# Patient Record
Sex: Female | Born: 1980 | Race: Black or African American | Hispanic: No | Marital: Married | Smoking: Never smoker
Health system: Southern US, Community
[De-identification: ages and names within clinical notes are randomized; demographics above are authoritative.]

## PROBLEM LIST (undated history)

## (undated) ENCOUNTER — Inpatient Hospital Stay (HOSPITAL_COMMUNITY): Payer: Self-pay

## (undated) DIAGNOSIS — R519 Headache, unspecified: Secondary | ICD-10-CM

## (undated) DIAGNOSIS — Z87440 Personal history of urinary (tract) infections: Secondary | ICD-10-CM

## (undated) DIAGNOSIS — D649 Anemia, unspecified: Secondary | ICD-10-CM

## (undated) DIAGNOSIS — M5386 Other specified dorsopathies, lumbar region: Secondary | ICD-10-CM

## (undated) DIAGNOSIS — H1850 Unspecified hereditary corneal dystrophies: Secondary | ICD-10-CM

## (undated) DIAGNOSIS — H18509 Unspecified hereditary corneal dystrophies, unspecified eye: Secondary | ICD-10-CM

## (undated) DIAGNOSIS — I10 Essential (primary) hypertension: Secondary | ICD-10-CM

## (undated) DIAGNOSIS — R51 Headache: Secondary | ICD-10-CM

## (undated) DIAGNOSIS — B54 Unspecified malaria: Secondary | ICD-10-CM

## (undated) HISTORY — DX: Essential (primary) hypertension: I10

## (undated) HISTORY — PX: OTHER SURGICAL HISTORY: SHX169

---

## 2015-12-07 ENCOUNTER — Inpatient Hospital Stay (HOSPITAL_COMMUNITY)
Admission: AD | Admit: 2015-12-07 | Discharge: 2015-12-07 | Disposition: A | Discharge status: Home / Self Care | Payer: Medicaid Other | Source: Ambulatory Visit | Attending: Obstetrics & Gynecology | Admitting: Obstetrics & Gynecology

## 2015-12-07 ENCOUNTER — Encounter (HOSPITAL_COMMUNITY): Payer: Self-pay | Admitting: *Deleted

## 2015-12-07 DIAGNOSIS — N390 Urinary tract infection, site not specified: Secondary | ICD-10-CM

## 2015-12-07 DIAGNOSIS — R300 Dysuria: Secondary | ICD-10-CM | POA: Diagnosis present

## 2015-12-07 DIAGNOSIS — R319 Hematuria, unspecified: Secondary | ICD-10-CM

## 2015-12-07 DIAGNOSIS — R350 Frequency of micturition: Secondary | ICD-10-CM | POA: Insufficient documentation

## 2015-12-07 DIAGNOSIS — Z79899 Other long term (current) drug therapy: Secondary | ICD-10-CM | POA: Insufficient documentation

## 2015-12-07 LAB — CBC WITH DIFFERENTIAL/PLATELET
BASOS ABS: 0 10*3/uL (ref 0.0–0.1)
BASOS PCT: 0 %
EOS ABS: 0.1 10*3/uL (ref 0.0–0.7)
EOS PCT: 1 %
HCT: 36.6 % (ref 36.0–46.0)
HEMOGLOBIN: 12.9 g/dL (ref 12.0–15.0)
LYMPHS ABS: 1.6 10*3/uL (ref 0.7–4.0)
Lymphocytes Relative: 21 %
MCH: 31.2 pg (ref 26.0–34.0)
MCHC: 35.2 g/dL (ref 30.0–36.0)
MCV: 88.4 fL (ref 78.0–100.0)
Monocytes Absolute: 0.3 10*3/uL (ref 0.1–1.0)
Monocytes Relative: 4 %
NEUTROS PCT: 74 %
Neutro Abs: 5.4 10*3/uL (ref 1.7–7.7)
PLATELETS: 280 10*3/uL (ref 150–400)
RBC: 4.14 MIL/uL (ref 3.87–5.11)
RDW: 13.4 % (ref 11.5–15.5)
WBC: 7.4 10*3/uL (ref 4.0–10.5)

## 2015-12-07 LAB — URINALYSIS, ROUTINE W REFLEX MICROSCOPIC
Bilirubin Urine: NEGATIVE
GLUCOSE, UA: NEGATIVE mg/dL
Ketones, ur: NEGATIVE mg/dL
NITRITE: NEGATIVE
PH: 6 (ref 5.0–8.0)
Protein, ur: 100 mg/dL — AB
SPECIFIC GRAVITY, URINE: 1.025 (ref 1.005–1.030)

## 2015-12-07 LAB — URINE MICROSCOPIC-ADD ON

## 2015-12-07 LAB — POCT PREGNANCY, URINE: Preg Test, Ur: NEGATIVE

## 2015-12-07 MED ORDER — SULFAMETHOXAZOLE-TRIMETHOPRIM 800-160 MG PO TABS: 1.0000 | ORAL_TABLET | Freq: Two times a day (BID) | ORAL | 1 refills | Status: DC

## 2015-12-07 MED ORDER — PHENAZOPYRIDINE HCL 100 MG PO TABS: 100.0000 mg | ORAL_TABLET | Freq: Three times a day (TID) | ORAL | 0 refills | Status: DC | PRN

## 2015-12-07 NOTE — MAU Provider Note (Signed)
Chief Complaint:  Dysuria   First Provider Initiated Contact with Patient 12/07/15 0818     HPI: Amber Haas is a 35 y.o. G5P5 who presents to maternity admissions reporting urinary frequency and burning since last night.  Also has nausea and body aches.  No vomiting or constipation.  Had a loose stool this morning.  No bleeding or frank hematuria.     She reports no vaginal bleeding, vaginal itching/burning, h/a, dizziness, or fever/chills.    Dysuria   This is a new problem. The current episode started yesterday. The problem occurs every urination. The problem has been unchanged. The quality of the pain is described as burning. The pain is moderate. There has been no fever. She is sexually active. There is no history of pyelonephritis. Associated symptoms include frequency, nausea and urgency. Pertinent negatives include no chills, discharge, flank pain, hematuria, sweats or vomiting. She has tried nothing for the symptoms.   RN Note: Pt reports dysuria and urinary frequency since 12mn, nausea and body aches  Past Medical History: History reviewed. No pertinent past medical history.  Past obstetric history: OB History  Gravida Para Term Preterm AB Living  5 5       5   SAB TAB Ectopic Multiple Live Births               # Outcome Date GA Lbr Len/2nd Weight Sex Delivery Anes PTL Lv  5 Para           4 Para           3 Para           2 Para           1 Para               Past Surgical History: History reviewed. No pertinent surgical history.  Family History: History reviewed. No pertinent family history.  Social History: Social History  Substance Use Topics  . Smoking status: Never Smoker  . Smokeless tobacco: Never Used  . Alcohol use No    Allergies: No Known Allergies  Meds:  Prescriptions Prior to Admission  Medication Sig Dispense Refill Last Dose  . SPIRULINA PO Take 2 tablets by mouth 3 (three) times daily.   12/06/2015 at Unknown time    I have  reviewed patient's Past Medical Hx, Surgical Hx, Family Hx, Social Hx, medications and allergies.  ROS:  Review of Systems  Constitutional: Negative for chills and fever.  Gastrointestinal: Positive for diarrhea (one loose stool) and nausea. Negative for abdominal pain, constipation and vomiting.  Genitourinary: Positive for dysuria, frequency and urgency. Negative for flank pain, hematuria, vaginal bleeding and vaginal discharge.  Musculoskeletal: Positive for back pain (low back soreness).   Other systems negative     Physical Exam  Patient Vitals for the past 24 hrs:  BP Temp Temp src Pulse Resp SpO2 Height Weight  12/07/15 0655 128/91 98.4 F (36.9 C) Oral 79 17 100 % 5\' 1"  (1.549 m) 148 lb (67.1 kg)   Constitutional: Well-developed, well-nourished female in no acute distress.  Cardiovascular: slightly tachycardic, 90-100, normal rhythm, no ectopy audible, S1 & S2 heard, no murmur Respiratory: normal effort, no distress. Lungs CTAB with no wheezes or crackles GI: Abd soft, non-tender.  Nondistended.  No rebound, No guarding.  Bowel Sounds audible  MS: Extremities nontender, no edema, normal ROM Neurologic: Alert and oriented x 4.   Grossly nonfocal. GU: Neg CVAT bilaterally Skin:  Warm and Dry Psych:  Affect appropriate.    Labs: Results for orders placed or performed during the hospital encounter of 12/07/15 (from the past 72 hour(s))  Urinalysis, Routine w reflex microscopic (not [facility])     Status: Abnormal   Collection Time: 12/07/15  6:40 AM  Result Value Ref Range   Color, Urine YELLOW YELLOW   APPearance CLOUDY (A) CLEAR   Specific Gravity, Urine 1.025 1.005 - 1.030   pH 6.0 5.0 - 8.0   Glucose, UA NEGATIVE NEGATIVE mg/dL   Hgb urine dipstick LARGE (A) NEGATIVE   Bilirubin Urine NEGATIVE NEGATIVE   Ketones, ur NEGATIVE NEGATIVE mg/dL   Protein, ur 161100 (A) NEGATIVE mg/dL   Nitrite NEGATIVE NEGATIVE   Leukocytes, UA MODERATE (A) NEGATIVE  Urine microscopic-add  on     Status: Abnormal   Collection Time: 12/07/15  6:40 AM  Result Value Ref Range   Squamous Epithelial / LPF 0-5 (A) NONE SEEN   WBC, UA 6-30 0 - 5 WBC/hpf   RBC / HPF TOO NUMEROUS TO COUNT 0 - 5 RBC/hpf   Bacteria, UA FEW (A) NONE SEEN  Culture, Urine     Status: None (Preliminary result)   Collection Time: 12/07/15  6:40 AM  Result Value Ref Range   Specimen Description URINE, CLEAN CATCH    Special Requests NONE    Culture      CULTURE REINCUBATED FOR BETTER GROWTH Performed at [facility]    Report Status PENDING   Pregnancy, urine POC     Status: None   Collection Time: 12/07/15  7:07 AM  Result Value Ref Range   Preg Test, Ur NEGATIVE NEGATIVE    Comment:        THE SENSITIVITY OF THIS METHODOLOGY IS >24 mIU/mL   CBC with Differential/Platelet     Status: None   Collection Time: 12/07/15  8:40 AM  Result Value Ref Range   WBC 7.4 4.0 - 10.5 K/uL   RBC 4.14 3.87 - 5.11 MIL/uL   Hemoglobin 12.9 12.0 - 15.0 g/dL   HCT 09.636.6 04.536.0 - 40.946.0 %   MCV 88.4 78.0 - 100.0 fL   MCH 31.2 26.0 - 34.0 pg   MCHC 35.2 30.0 - 36.0 g/dL   RDW 81.113.4 91.411.5 - 78.215.5 %   Platelets 280 150 - 400 K/uL   Neutrophils Relative % 74 %   Neutro Abs 5.4 1.7 - 7.7 K/uL   Lymphocytes Relative 21 %   Lymphs Abs 1.6 0.7 - 4.0 K/uL   Monocytes Relative 4 %   Monocytes Absolute 0.3 0.1 - 1.0 K/uL   Eosinophils Relative 1 %   Eosinophils Absolute 0.1 0.0 - 0.7 K/uL   Basophils Relative 0 %   Basophils Absolute 0.0 0.0 - 0.1 K/uL    Imaging:  No results found.  MAU Course/MDM: I have ordered labs as follows:  UA, urine culture and CBC added due to slight tachycardia Imaging ordered: none Results reviewed.   No evidence of sepsis.  Probable UTI. WIll treat and culture urine  Pt stable at time of discharge.  Assessment: Probable urinary tract infection  Plan: Discharge home Recommend push PO fluids Rx sent for Bactrim DS for presumed UTI Rx sent for pyridium for  dysuria  Encouraged to return here or to other Urgent Care/ED if she develops worsening of symptoms, increase in pain, fever, or other concerning symptoms.   Wynelle BourgeoisMarie Chauncey Sciulli CNM, MSN Certified Nurse-Midwife 12/07/2015 8:18 AM

## 2015-12-07 NOTE — Discharge Instructions (Signed)

## 2015-12-07 NOTE — MAU Note (Signed)
Pt reports dysuria and urinary frequency since 12mn, nausea and body aches.

## 2015-12-10 LAB — URINE CULTURE: Culture: >=100000 — AB

## 2015-12-11 ENCOUNTER — Other Ambulatory Visit (HOSPITAL_COMMUNITY): Payer: Self-pay | Admitting: Obstetrics and Gynecology

## 2016-01-04 ENCOUNTER — Ambulatory Visit (HOSPITAL_COMMUNITY)
Admission: EM | Admit: 2016-01-04 | Discharge: 2016-01-04 | Disposition: A | Discharge status: Home / Self Care | Payer: Medicaid Other | Attending: Family Medicine | Admitting: Family Medicine

## 2016-01-04 ENCOUNTER — Encounter (HOSPITAL_COMMUNITY): Payer: Self-pay | Admitting: Emergency Medicine

## 2016-01-04 DIAGNOSIS — B9689 Other specified bacterial agents as the cause of diseases classified elsewhere: Secondary | ICD-10-CM

## 2016-01-04 DIAGNOSIS — B373 Candidiasis of vulva and vagina: Secondary | ICD-10-CM | POA: Insufficient documentation

## 2016-01-04 DIAGNOSIS — N898 Other specified noninflammatory disorders of vagina: Secondary | ICD-10-CM

## 2016-01-04 DIAGNOSIS — Z79899 Other long term (current) drug therapy: Secondary | ICD-10-CM | POA: Diagnosis not present

## 2016-01-04 DIAGNOSIS — B3731 Acute candidiasis of vulva and vagina: Secondary | ICD-10-CM

## 2016-01-04 DIAGNOSIS — N760 Acute vaginitis: Secondary | ICD-10-CM | POA: Insufficient documentation

## 2016-01-04 LAB — POCT PREGNANCY, URINE: Preg Test, Ur: NEGATIVE

## 2016-01-04 MED ORDER — FLUCONAZOLE 150 MG PO TABS: ORAL_TABLET | ORAL | 0 refills | Status: DC

## 2016-01-04 MED ORDER — METRONIDAZOLE 500 MG PO TABS: 500.0000 mg | ORAL_TABLET | Freq: Two times a day (BID) | ORAL | 0 refills | Status: DC

## 2016-01-04 NOTE — ED Provider Notes (Signed)
CSN: [id number]     Arrival date & time 01/04/16  1051 History   First MD Initiated Contact with Patient 01/04/16 1241     Chief Complaint  Patient presents with  . Vaginal Discharge   (Consider location/radiation/quality/duration/timing/severity/associated sxs/prior Treatment) 35 year old female with a two-week history of vaginal discharge described as thick white malodorous associated with local vulvovaginal irritation and burning. She states this started several days after a course of Septra for UTI seen in emergency department. She states her urinary symptoms have all abated.      History reviewed. No pertinent past medical history. History reviewed. No pertinent surgical history. No family history on file. Social History  Substance Use Topics  . Smoking status: Never Smoker  . Smokeless tobacco: Never Used  . Alcohol use No   OB History    Gravida Para Term Preterm AB Living   5 5       5    SAB TAB Ectopic Multiple Live Births                 Review of Systems  Constitutional: Negative.   Genitourinary: Positive for vaginal discharge and vaginal pain. Negative for dysuria, flank pain, frequency, genital sores, hematuria, menstrual problem, pelvic pain and vaginal bleeding.  Musculoskeletal: Negative.   Neurological: Negative.   All other systems reviewed and are negative.   Allergies  Review of patient's allergies indicates no known allergies.  Home Medications   Prior to Admission medications   Medication Sig Start Date End Date Taking? Authorizing Provider  CYCLOBENZAPRINE HCL PO Take by mouth.   Yes Historical Provider, MD  ibuprofen (ADVIL,MOTRIN) 200 MG tablet Take 200 mg by mouth every 6 (six) hours as needed.   Yes Historical Provider, MD  fluconazole (DIFLUCAN) 150 MG tablet 1 tab po x 1. May repeat in 72 hours if no improvement 01/04/16   Hayden Rasmussen, NP  metroNIDAZOLE (FLAGYL) 500 MG tablet Take 1 tablet (500 mg total) by mouth 2 (two) times daily. X 7  days 01/04/16   Hayden Rasmussen, NP  phenazopyridine (PYRIDIUM) 100 MG tablet Take 1 tablet (100 mg total) by mouth 3 (three) times daily as needed for pain. Patient not taking: Reported on 01/04/2016 12/07/15   Aviva Signs, CNM   Meds Ordered and Administered this Visit  Medications - No data to display  BP (!) 142/105 (BP Location: Right Arm)   Pulse 83   Temp 97.9 F (36.6 C) (Oral)   Resp 18   LMP 12/21/2015   SpO2 100%  No data found.   Physical Exam  Constitutional: She is oriented to person, place, and time. She appears well-developed and well-nourished. No distress.  Neck: Neck supple.  Cardiovascular: Normal rate.   Pulmonary/Chest: Effort normal. No respiratory distress.  Abdominal: Soft. She exhibits no distension. There is no tenderness.  Genitourinary:  Genitourinary Comments: Normal external female genitalia. There is a scant amount of tan white discharge at the introitus. The vaginal walls are erythematous and coated with a cottage cheese textured discharge. The cervix is just left of the midline and coated with same material as well as a creamy thin vanilla colored discharge. Ectocervix with minor erythema. No CMT. No right adnexal tenderness. Minor left adnexal tenderness. No palpable masses.  Musculoskeletal: Normal range of motion. She exhibits no edema.  Neurological: She is alert and oriented to person, place, and time. No cranial nerve deficit. She exhibits normal muscle tone.  Skin: Skin is warm and dry.  Psychiatric:  She has a normal mood and affect.  Nursing note and vitals reviewed.   Urgent Care Course   Clinical Course    Procedures (including critical care time)  Labs Review Labs Reviewed  URINE CULTURE  POCT PREGNANCY, URINE  CERVICOVAGINAL ANCILLARY ONLY   Results for orders placed or performed during the hospital encounter of 01/04/16  Pregnancy, urine POC  Result Value Ref Range   Preg Test, Ur NEGATIVE NEGATIVE     Imaging  Review No results found.   Visual Acuity Review  Right Eye Distance:   Left Eye Distance:   Bilateral Distance:    Right Eye Near:   Left Eye Near:    Bilateral Near:         MDM   1. Bacterial vaginosis   2. Yeast vaginitis   3. Vaginal discharge    Take the medications as directed. A culture of your urine has been obtained. Samples for other infections were obtained during the pelvic exam. If any of these tests come back positive for organisms that you were not treated for here we can call you on the phone for treatment/prescriptions. For worsening, new symptoms problems may return. Meds ordered this encounter  Medications  . ibuprofen (ADVIL,MOTRIN) 200 MG tablet    Sig: Take 200 mg by mouth every 6 (six) hours as needed.  . CYCLOBENZAPRINE HCL PO    Sig: Take by mouth.  . fluconazole (DIFLUCAN) 150 MG tablet    Sig: 1 tab po x 1. May repeat in 72 hours if no improvement    Dispense:  2 tablet    Refill:  0    Order Specific Question:   Supervising Provider    Answer:   Linna HoffKINDL, JAMES D [phone][[id number]]  . metroNIDAZOLE (FLAGYL) 500 MG tablet    Sig: Take 1 tablet (500 mg total) by mouth 2 (two) times daily. X 7 days    Dispense:  14 tablet    Refill:  0    Order Specific Question:   Supervising Provider    Answer:   Linna HoffKINDL, JAMES D [[id number]]       Hayden Rasmussenavid Torianne Laflam, NP 01/04/16 1320

## 2016-01-04 NOTE — ED Triage Notes (Signed)
Onset 12 days ago of vaginal discharge.  Used 7 day cream, then had menstrual cycle and now symptoms continued.  Burning and irritation and odor to discharge that is different from usual.  No abdominal pain or back pain

## 2016-01-04 NOTE — Discharge Instructions (Signed)
Take the medications as directed. A culture of your urine has been obtained. Samples for other infections were obtained during the pelvic exam. If any of these tests come back positive for organisms that you were not treated for here we can call you on the phone for treatment/prescriptions. For worsening, new symptoms problems may return.

## 2016-01-05 LAB — CERVICOVAGINAL ANCILLARY ONLY
Chlamydia: NEGATIVE
NEISSERIA GONORRHEA: NEGATIVE
Wet Prep (BD Affirm): POSITIVE — AB

## 2016-01-05 LAB — URINE CULTURE

## 2016-01-07 ENCOUNTER — Emergency Department (HOSPITAL_COMMUNITY)
Admission: EM | Admit: 2016-01-07 | Discharge: 2016-01-08 | Disposition: A | Discharge status: Home / Self Care | Payer: Medicaid Other | Attending: Emergency Medicine | Admitting: Emergency Medicine

## 2016-01-07 ENCOUNTER — Emergency Department (HOSPITAL_COMMUNITY): Payer: Medicaid Other

## 2016-01-07 ENCOUNTER — Other Ambulatory Visit: Payer: Self-pay

## 2016-01-07 ENCOUNTER — Telehealth (HOSPITAL_COMMUNITY): Payer: Self-pay | Admitting: Internal Medicine

## 2016-01-07 ENCOUNTER — Encounter (HOSPITAL_COMMUNITY): Payer: Self-pay

## 2016-01-07 DIAGNOSIS — M79602 Pain in left arm: Secondary | ICD-10-CM

## 2016-01-07 DIAGNOSIS — R200 Anesthesia of skin: Secondary | ICD-10-CM | POA: Diagnosis not present

## 2016-01-07 DIAGNOSIS — M79605 Pain in left leg: Secondary | ICD-10-CM

## 2016-01-07 LAB — BASIC METABOLIC PANEL
Anion gap: 8 (ref 5–15)
BUN: 11 mg/dL (ref 6–20)
CALCIUM: 9.5 mg/dL (ref 8.9–10.3)
CO2: 25 mmol/L (ref 22–32)
CREATININE: 1.02 mg/dL — AB (ref 0.44–1.00)
Chloride: 107 mmol/L (ref 101–111)
GFR calc Af Amer: >60 mL/min (ref >60)
GLUCOSE: 92 mg/dL (ref 65–99)
Potassium: 4 mmol/L (ref 3.5–5.1)
SODIUM: 140 mmol/L (ref 135–145)

## 2016-01-07 LAB — CBC
HEMATOCRIT: 41.6 % (ref 36.0–46.0)
Hemoglobin: 14.7 g/dL (ref 12.0–15.0)
MCH: 31.4 pg (ref 26.0–34.0)
MCHC: 35.3 g/dL (ref 30.0–36.0)
MCV: 88.9 fL (ref 78.0–100.0)
PLATELETS: 306 10*3/uL (ref 150–400)
RBC: 4.68 MIL/uL (ref 3.87–5.11)
RDW: 12.7 % (ref 11.5–15.5)
WBC: 5.1 10*3/uL (ref 4.0–10.5)

## 2016-01-07 LAB — I-STAT TROPONIN, ED: TROPONIN I, POC: 0 ng/mL (ref 0.00–0.08)

## 2016-01-07 MED ORDER — TRAMADOL HCL 50 MG PO TABS: 50.0000 mg | ORAL_TABLET | Freq: Four times a day (QID) | ORAL | 0 refills | Status: DC | PRN

## 2016-01-07 MED ORDER — CEPHALEXIN 500 MG PO CAPS: 500.0000 mg | ORAL_CAPSULE | Freq: Two times a day (BID) | ORAL | 0 refills | Status: DC

## 2016-01-07 MED ORDER — CEPHALEXIN 500 MG PO CAPS: 500.0000 mg | ORAL_CAPSULE | Freq: Two times a day (BID) | ORAL | 0 refills | Status: AC

## 2016-01-07 NOTE — ED Provider Notes (Signed)
[facility]-EMERGENCY DEPT Provider Note   CSN: [id number] Arrival date & time: 01/07/16  2028     History   Chief Complaint Chief Complaint  Patient presents with  . Chest Pain  . Arm Pain  . Leg Pain    HPI Amber Haas is a 34 y.o. female.  Patient reports tingling type pain in her left leg, arm, neck and torso. She reports weakness in the same areas and difficulty standing at times. Symptoms started several months ago and have been progressively worsening. No fever, SOB, cough, nausea/vomiting, diarrhea or change in bowel habits, headache or visual changes. She reports her urine appears darker than usual but no dysuria, frequency or urgency. No incontinence of bowel or bladder. She was seen at Urgent Care 2 weeks ago and provided anti-inflammatory pain relievers and a muscle relaxer which she reports did not change the severity of her symptoms or progression of symptoms.   The history is provided by the patient. No language interpreter was used.  Chest Pain   Associated symptoms include leg pain, numbness and weakness (See HPI.). Pertinent negatives include no fever, no headaches, no nausea and no vomiting.  Arm Pain  Associated symptoms include chest pain (Reports cramping, tingling type pain similar to that experienced in her left extremities.). Pertinent negatives include no headaches.  Leg Pain   Associated symptoms include numbness.    History reviewed. No pertinent past medical history.  There are no active problems to display for this patient.   History reviewed. No pertinent surgical history.  OB History    Gravida Para Term Preterm AB Living   5 5       5    SAB TAB Ectopic Multiple Live Births                   Home Medications    Prior to Admission medications   Medication Sig Start Date End Date Taking? Authorizing Provider  cephALEXin (KEFLEX) 500 MG capsule Take 1 capsule (500 mg total) by mouth 2 (two) times daily. 01/07/16 01/12/16 Yes Eustace Moore, MD  cyclobenzaprine (FLEXERIL) 10 MG tablet Take 10 mg by mouth 3 (three) times daily as needed for muscle spasms.   Yes Historical Provider, MD  ibuprofen (ADVIL,MOTRIN) 200 MG tablet Take 200 mg by mouth every 6 (six) hours as needed for mild pain.    Yes Historical Provider, MD  metroNIDAZOLE (FLAGYL) 500 MG tablet Take 1 tablet (500 mg total) by mouth 2 (two) times daily. X 7 days 01/04/16  Yes Hayden Rasmussen, NP  fluconazole (DIFLUCAN) 150 MG tablet 1 tab po x 1. May repeat in 72 hours if no improvement Patient not taking: Reported on 01/07/2016 01/04/16   Hayden Rasmussen, NP    Family History History reviewed. No pertinent family history.  Social History Social History  Substance Use Topics  . Smoking status: Never Smoker  . Smokeless tobacco: Never Used  . Alcohol use No     Allergies   Review of patient's allergies indicates no known allergies.   Review of Systems Review of Systems  Constitutional: Negative for chills and fever.  HENT: Negative.   Eyes: Negative for visual disturbance.  Respiratory: Negative.   Cardiovascular: Positive for chest pain (Reports cramping, tingling type pain similar to that experienced in her left extremities.).  Gastrointestinal: Negative.  Negative for constipation, diarrhea, nausea and vomiting.  Genitourinary: Negative.  Negative for dysuria, enuresis and frequency.  Musculoskeletal: Negative.  Pain in left extremities described as tingling pain.  Skin: Negative.   Neurological: Positive for weakness (See HPI.) and numbness. Negative for headaches.     Physical Exam Updated Vital Signs BP 130/98   Pulse 79   Temp 99 F (37.2 C) (Oral)   Resp 21   Ht 5' (1.524 m)   Wt 67.1 kg   LMP 12/21/2015   SpO2 100%   BMI 28.90 kg/m   Physical Exam  Constitutional: She is oriented to person, place, and time. She appears well-developed and well-nourished.  HENT:  Head: Normocephalic.  Eyes: Pupils are equal, round, and  reactive to light.  Neck: Normal range of motion. Neck supple.  Cardiovascular: Normal rate, regular rhythm and intact distal pulses.   No carotid bruits.  Pulmonary/Chest: Effort normal and breath sounds normal.  Abdominal: Soft. Bowel sounds are normal. There is no tenderness. There is no rebound and no guarding.  Musculoskeletal: Normal range of motion.  FROM with equal strength bilateral UE's and LE's.  Neurological: She is alert and oriented to person, place, and time. She has normal strength and normal reflexes. No sensory deficit. She displays a negative Romberg sign. Coordination normal.  CM's 3-12 intact. Speech clear and focused. No deficits of coordination. Ambulatory with foot drop, imbalance or guarding.  Skin: Skin is warm and dry. No rash noted.  Psychiatric: She has a normal mood and affect.     ED Treatments / Results  Labs (all labs ordered are listed, but only abnormal results are displayed) Labs Reviewed  BASIC METABOLIC PANEL - Abnormal; Notable for the following:       Result Value   Creatinine, Ser 1.02 (*)    All other components within normal limits  CBC  I-STAT TROPOININ, ED   Results for orders placed or performed during the hospital encounter of 01/07/16  Basic metabolic panel  Result Value Ref Range   Sodium 140 135 - 145 mmol/L   Potassium 4.0 3.5 - 5.1 mmol/L   Chloride 107 101 - 111 mmol/L   CO2 25 22 - 32 mmol/L   Glucose, Bld 92 65 - 99 mg/dL   BUN 11 6 - 20 mg/dL   Creatinine, Ser 4.09 (H) 0.44 - 1.00 mg/dL   Calcium 9.5 8.9 - 81.1 mg/dL   GFR calc non Af Amer >60 >60 mL/min   GFR calc Af Amer >60 >60 mL/min   Anion gap 8 5 - 15  CBC  Result Value Ref Range   WBC 5.1 4.0 - 10.5 K/uL   RBC 4.68 3.87 - 5.11 MIL/uL   Hemoglobin 14.7 12.0 - 15.0 g/dL   HCT 91.4 78.2 - 95.6 %   MCV 88.9 78.0 - 100.0 fL   MCH 31.4 26.0 - 34.0 pg   MCHC 35.3 30.0 - 36.0 g/dL   RDW 21.3 08.6 - 57.8 %   Platelets 306 150 - 400 K/uL  I-stat troponin, ED    Result Value Ref Range   Troponin i, poc 0.00 0.00 - 0.08 ng/mL   Comment 3           Dg Chest 2 View  Result Date: 01/07/2016 CLINICAL DATA:  LEFT chest pain, LEFT arm pain and shortness of breath tonight, recurrent symptoms for 1 week. EXAM: CHEST  2 VIEW COMPARISON:  None. FINDINGS: Cardiomediastinal silhouette is normal. No pleural effusions or focal consolidations. Trachea projects midline and there is no pneumothorax. Soft tissue planes and included osseous structures are non-suspicious. IMPRESSION: Normal  chest. Electronically Signed   By: Awilda Metroourtnay  Bloomer M.D.   On: 01/07/2016 23:16     EKG  EKG Interpretation None       Radiology Dg Chest 2 View  Result Date: 01/07/2016 CLINICAL DATA:  LEFT chest pain, LEFT arm pain and shortness of breath tonight, recurrent symptoms for 1 week. EXAM: CHEST  2 VIEW COMPARISON:  None. FINDINGS: Cardiomediastinal silhouette is normal. No pleural effusions or focal consolidations. Trachea projects midline and there is no pneumothorax. Soft tissue planes and included osseous structures are non-suspicious. IMPRESSION: Normal chest. Electronically Signed   By: Awilda Metroourtnay  Bloomer M.D.   On: 01/07/2016 23:16    Procedures Procedures (including critical care time)  Medications Ordered in ED Medications - No data to display   Initial Impression / Assessment and Plan / ED Course  I have reviewed the triage vital signs and the nursing notes.  Pertinent labs & imaging results that were available during my care of the patient were reviewed by me and considered in my medical decision making (see chart for details).  Clinical Course    Patient with unilateral symptoms of sharp tingling, cramping, perceived weakness x months, progressive in nature. Differential includes neurologic disorders like MS. Feel she will need full neurologic outpatient work up and will refer. No focal deficits noted on exam tonight. VSS. She can be discharged home and is  strongly encouraged to seek follow up outpatient.  Final Clinical Impressions(s) / ED Diagnoses   Final diagnoses:  None   1. Left sided numbness  New Prescriptions New Prescriptions   No medications on file     Elpidio AnisShari Edmundo Tedesco, Cordelia Poche-C 01/07/16 2348    Blane OharaJoshua Zavitz, MD 01/08/16 [phone]

## 2016-01-07 NOTE — Telephone Encounter (Signed)
Please let patient know that urine culture was positive for Strep.  Rx for cephalexin was sent to the pharmacy of record, [facility] on [address] [facility] [place].  Tests for candida (yeast) and gardnerella (bacterial vaginosis) were also positive.  [facility].  Recheck for further evaluation if symptoms persist.  LM

## 2016-01-07 NOTE — ED Triage Notes (Signed)
Pt complaining of L arm and leg pain. Pt also complaining of some intermittent chest pain and SOB x 3 weeks. Pt denies any long distance travel. Pt is hypertensive at triage.

## 2016-01-07 NOTE — Discharge Instructions (Signed)
It is important for you to follow up with neurology for further evaluation of progressive left sided symptoms. Call an make an appointment as soon as possible.

## 2016-01-07 NOTE — ED Notes (Signed)
Pt has multiple complaints. C/o intermittent chest pain x "several weeks", leg pain, and heart palpitations. Denies dizziness/lightheadedness, shortness of breath, n/v/d.

## 2016-01-08 ENCOUNTER — Telehealth (HOSPITAL_COMMUNITY): Payer: Self-pay | Admitting: Emergency Medicine

## 2016-01-08 NOTE — Telephone Encounter (Signed)
-----   Message from Eustace MooreLaura W Murray, MD sent at 01/07/2016  4:47 PM EDT ----- Clinical staff, please let patient know that urine culture was positive for Strep.   Rx for cephalexin was sent to the pharmacy of record, [facility] on [address] at [address] [place].   Tests for candida (yeast) and gardnerella (bacterial vaginosis) were also positive.   [facility]con[facility].   Recheck for further evaluation if symptoms persist.  LM

## 2016-01-08 NOTE — Telephone Encounter (Signed)
LM on [phone] Need to give lab results and to see how pt is doing from recent visit on 01/04/16 Also let pt know labs can be obtained from MyChart Notified of pending Rx sent to pharmacy

## 2016-01-12 NOTE — Telephone Encounter (Signed)
Pt called back... notified of recent lab results  Pt ID'd properly... Reports feeling better and sx have subsided but urine is still dark and cloudy States she was able to p/u Rx yest for Urine Culture Adv pt if sx are not getting better to return or to f/u w/PCP Education on safe sex given Pt verb understanding.

## 2016-01-22 ENCOUNTER — Encounter: Payer: Self-pay | Admitting: *Deleted

## 2016-01-23 ENCOUNTER — Encounter: Payer: Self-pay | Admitting: Diagnostic Neuroimaging

## 2016-01-23 ENCOUNTER — Ambulatory Visit (INDEPENDENT_AMBULATORY_CARE_PROVIDER_SITE_OTHER): Payer: Medicaid Other | Admitting: Diagnostic Neuroimaging

## 2016-01-23 VITALS — BP 140/95 | HR 93 | Ht 61.0 in | Wt 154.0 lb

## 2016-01-23 DIAGNOSIS — M79605 Pain in left leg: Secondary | ICD-10-CM

## 2016-01-23 DIAGNOSIS — M62838 Other muscle spasm: Secondary | ICD-10-CM | POA: Diagnosis not present

## 2016-01-23 DIAGNOSIS — M25512 Pain in left shoulder: Secondary | ICD-10-CM

## 2016-01-23 NOTE — Progress Notes (Signed)
[facility]  PATIENT: Amber Haas DOB: 01/18/1981  REFERRING CLINICIAN: ER  HISTORY FROM: patient  REASON FOR VISIT: new consult    HISTORICAL  CHIEF COMPLAINT:  Chief Complaint  Patient presents with  . Numbness    rm 6, New Pt, ED referral, " shooting pains, spasms, muscle tightness, cramping on left side of body and spine since Dec 2016, progressing"    HISTORY OF PRESENT ILLNESS:   35 year old right-handed female here for evaluation of shooting pains, muscle spasms, neck tightness, muscle cramps. Symptoms started December 2016 after her fifth pregnancy and delivery. Patient had normal vaginal delivery, complicated by excessive hemorrhaging, anemia and 1 extra day in the hospital. She also had slightly elevated blood pressure following the pregnancy. Ever since that time she's had intermittent waves of elective incision or his body, spine, intermittent muscle spasms, shocks of pain, sensitivity pain. Sometimes her hands like a. Symptoms she has pain in the back for left leg. Sometimes has heart racing and rush sensation in her body.  Also of note patient is late on her last menstrual cycle and may be pregnant again.   REVIEW OF SYSTEMS: Full 14 system review of systems performed and negative with exception of: Chest pain palpitation birthmarks joint pain joint sewing cramps aching muscles in the sleep slurred speech lymph node enlargement feeling hot feeling cold blurred vision.  ALLERGIES: No Known Allergies  HOME MEDICATIONS: Outpatient Medications Prior to Visit  Medication Sig Dispense Refill  . cyclobenzaprine (FLEXERIL) 10 MG tablet Take 10 mg by mouth 3 (three) times daily as needed for muscle spasms.    Marland Kitchen. ibuprofen (ADVIL,MOTRIN) 200 MG tablet Take 200 mg by mouth every 6 (six) hours as needed for mild pain.     . traMADol (ULTRAM) 50 MG tablet Take 1 tablet (50 mg total) by mouth every 6 (six) hours as needed. 15 tablet 0  . fluconazole  (DIFLUCAN) 150 MG tablet 1 tab po x 1. May repeat in 72 hours if no improvement (Patient not taking: Reported on 01/07/2016) 2 tablet 0  . metroNIDAZOLE (FLAGYL) 500 MG tablet Take 1 tablet (500 mg total) by mouth 2 (two) times daily. X 7 days 14 tablet 0   No facility-administered medications prior to visit.     PAST MEDICAL HISTORY: Past Medical History:  Diagnosis Date  . Hypertension     PAST SURGICAL HISTORY: Past Surgical History:  Procedure Laterality Date  . axillary disection Left    years ago    FAMILY HISTORY: Family History  Problem Relation Age of Onset  . Hypertension Father   . Diabetes Maternal Grandmother   . Heart disease Paternal Grandmother     SOCIAL HISTORY:  Social History   Social History  . Marital status: Married    Spouse name: Risk analystBaBatonck  . Number of children: 5  . Years of education: 1916   Occupational History  .      home maker   Social History Main Topics  . Smoking status: Never Smoker  . Smokeless tobacco: Never Used  . Alcohol use No     Comment: occasional  . Drug use: No  . Sexual activity: Yes    Birth control/ protection: None   Other Topics Concern  . Not on file   Social History Narrative   Lives at home with husband, children    caffeine- not every day     PHYSICAL EXAM  GENERAL EXAM/CONSTITUTIONAL: Vitals:  Vitals:   01/23/16 1018  BP: (!) 140/95  Pulse: 93  Weight: 154 lb (69.9 kg)  Height: 5\' 1"  (1.549 m)     Body mass index is 29.1 kg/m.  Visual Acuity Screening   Right eye Left eye Both eyes  Without correction: 20/30 20/30   With correction:        Patient is in no distress; well developed, nourished and groomed; neck is supple  CARDIOVASCULAR:  Examination of carotid arteries is normal; no carotid bruits  Regular rate and rhythm, no murmurs  Examination of peripheral vascular system by observation and palpation is normal  EYES:  Ophthalmoscopic exam of optic discs and posterior  segments is normal; no papilledema or hemorrhages  MUSCULOSKELETAL:  Gait, strength, tone, movements noted in Neurologic exam below  NEUROLOGIC: MENTAL STATUS:  No flowsheet data found.  awake, alert, oriented to person, place and time  recent and remote memory intact  normal attention and concentration  language fluent, comprehension intact, naming intact,   fund of knowledge appropriate  CRANIAL NERVE:   2nd - no papilledema on fundoscopic exam  2nd, 3rd, 4th, 6th - pupils equal and reactive to light, visual fields full to confrontation, extraocular muscles intact, no nystagmus  5th - facial sensation symmetric  7th - facial strength symmetric  8th - hearing intact  9th - palate elevates symmetrically, uvula midline  11th - shoulder shrug symmetric  12th - tongue protrusion midline  MOTOR:   normal bulk and tone, full strength in the BUE, BLE  SENSORY:   normal and symmetric to light touch, temperature, vibration  COORDINATION:   finger-nose-finger, fine finger movements normal  REFLEXES:   deep tendon reflexes present and symmetric  GAIT/STATION:   narrow based gait; romberg is negative    DIAGNOSTIC DATA (LABS, IMAGING, TESTING) - I reviewed patient records, labs, notes, testing and imaging myself where available.  Lab Results  Component Value Date   WBC 5.1 01/07/2016   HGB 14.7 01/07/2016   HCT 41.6 01/07/2016   MCV 88.9 01/07/2016   PLT 306 01/07/2016      Component Value Date/Time   NA 140 01/07/2016 2045   K 4.0 01/07/2016 2045   CL 107 01/07/2016 2045   CO2 25 01/07/2016 2045   GLUCOSE 92 01/07/2016 2045   BUN 11 01/07/2016 2045   CREATININE 1.02 (H) 01/07/2016 2045   CALCIUM 9.5 01/07/2016 2045   GFRNONAA >60 01/07/2016 2045   GFRAA >60 01/07/2016 2045   No results found for: CHOL, HDL, LDLCALC, LDLDIRECT, TRIG, CHOLHDL No results found for: ZOXW9UHGBA1C No results found for: VITAMINB12 No results found for:  TSH     ASSESSMENT AND PLAN  35 y.o. year old female here with constellation of symptoms of numbness, pain, electrical sensations, muscle cramps since December 2016 after pregnancy #5. Recommend further workup however this is limited due to possibility of now having pregnancy #6.   Ddx: musculoskeletal, cervical / lumbar radiculopathy, anxiety, stress, metabolic  1. Muscle spasm   2. Left leg pain   3. Left shoulder pain, unspecified chronicity      PLAN: - patient reports being late on her last menstrual cycle; therefore she may possibly be pregnant currently - will check lab testing and MRI after pregnancy is confirmed or not (such as B12, TSH, A1c, MRI brain and cervical spine)  Return in about 3 months (around 04/24/2016).    Suanne MarkerVIKRAM R. PENUMALLI, MD 01/23/2016, 11:09 AM Certified in Neurology, Neurophysiology and Neuroimaging  [facility] [address],  [place], [place] [address] [phone]

## 2016-02-03 ENCOUNTER — Ambulatory Visit (HOSPITAL_COMMUNITY)
Admission: EM | Admit: 2016-02-03 | Discharge: 2016-02-03 | Disposition: A | Discharge status: Home / Self Care | Payer: Medicaid Other | Attending: Family Medicine | Admitting: Family Medicine

## 2016-02-03 ENCOUNTER — Encounter (HOSPITAL_COMMUNITY): Payer: Self-pay | Admitting: Emergency Medicine

## 2016-02-03 DIAGNOSIS — Z3A01 Less than 8 weeks gestation of pregnancy: Secondary | ICD-10-CM | POA: Insufficient documentation

## 2016-02-03 DIAGNOSIS — B3731 Acute candidiasis of vulva and vagina: Secondary | ICD-10-CM

## 2016-02-03 DIAGNOSIS — B373 Candidiasis of vulva and vagina: Secondary | ICD-10-CM

## 2016-02-03 DIAGNOSIS — O161 Unspecified maternal hypertension, first trimester: Secondary | ICD-10-CM | POA: Diagnosis not present

## 2016-02-03 DIAGNOSIS — Z79899 Other long term (current) drug therapy: Secondary | ICD-10-CM | POA: Diagnosis not present

## 2016-02-03 DIAGNOSIS — O98811 Other maternal infectious and parasitic diseases complicating pregnancy, first trimester: Secondary | ICD-10-CM | POA: Diagnosis not present

## 2016-02-03 DIAGNOSIS — N898 Other specified noninflammatory disorders of vagina: Secondary | ICD-10-CM | POA: Diagnosis present

## 2016-02-03 MED ORDER — TERCONAZOLE 0.4 % VA CREA: 1.0000 | TOPICAL_CREAM | Freq: Every day | VAGINAL | 0 refills | Status: AC

## 2016-02-03 NOTE — ED Provider Notes (Signed)
[facility]    CSN: [id number] Arrival date & time: 02/03/16  1923     History   Chief Complaint Chief Complaint  Patient presents with  . Vaginal Discharge    HPI Amber Haas is a 35 y.o. female.   The history is provided by the patient. No language interpreter was used.  Vaginal Discharge  Quality:  White (cheezy and watery at times, associated with smell) Severity:  Moderate Onset quality:  Gradual Duration:  3 days Timing:  Constant Progression:  Worsening Chronicity:  Recurrent Context comment:  She is [redacted] weeks pregnant. Relieved by:  Nothing Worsened by:  Nothing Ineffective treatments: Natural remedies  Associated symptoms: vaginal itching   Associated symptoms: no abdominal pain, no dysuria, no fever, no genital lesions, no nausea and no vomiting   Associated symptoms comment:  Vaginal irritation. Risk factors: no STI and no STI exposure     Past Medical History:  Diagnosis Date  . Hypertension     There are no active problems to display for this patient.   Past Surgical History:  Procedure Laterality Date  . axillary disection Left    years ago    OB History    Gravida Para Term Preterm AB Living   6 5       5    SAB TAB Ectopic Multiple Live Births                   Home Medications    Prior to Admission medications   Medication Sig Start Date End Date Taking? Authorizing Provider  cyclobenzaprine (FLEXERIL) 10 MG tablet Take 10 mg by mouth 3 (three) times daily as needed for muscle spasms.    Historical Provider, MD  ibuprofen (ADVIL,MOTRIN) 200 MG tablet Take 200 mg by mouth every 6 (six) hours as needed for mild pain.     Historical Provider, MD  traMADol (ULTRAM) 50 MG tablet Take 1 tablet (50 mg total) by mouth every 6 (six) hours as needed. 01/07/16   Elpidio AnisShari Upstill, PA-C    Family History Family History  Problem Relation Age of Onset  . Hypertension Father   . Diabetes Maternal Grandmother   . Heart  disease Paternal Grandmother     Social History Social History  Substance Use Topics  . Smoking status: Never Smoker  . Smokeless tobacco: Never Used  . Alcohol use No     Comment: occasional     Allergies   Patient has no known allergies.   Review of Systems Review of Systems  Constitutional: Negative for fever.  Respiratory: Negative.   Gastrointestinal: Negative.  Negative for abdominal pain, nausea and vomiting.  Genitourinary: Positive for vaginal discharge. Negative for dysuria.  All other systems reviewed and are negative.    Physical Exam Triage Vital Signs ED Triage Vitals [02/03/16 1943]  Enc Vitals Group     BP 127/75     Pulse Rate 106     Resp 16     Temp 98.7 F (37.1 C)     Temp Source Oral     SpO2 100 %     Weight      Height      Head Circumference      Peak Flow      Pain Score 6     Pain Loc      Pain Edu?      Excl. in GC?    No data found.   Updated Vital Signs BP 127/75 (BP  Location: Left Arm)   Pulse 106   Temp 98.7 F (37.1 C) (Oral)   Resp 16   LMP 12/21/2015 Comment: 01/23/16 "I may be pregnant."  SpO2 100%   Visual Acuity Right Eye Distance:   Left Eye Distance:   Bilateral Distance:    Right Eye Near:   Left Eye Near:    Bilateral Near:     Physical Exam  Constitutional: She appears well-developed.  Cardiovascular: Normal rate, regular rhythm and normal heart sounds.   No murmur heard. Pulmonary/Chest: Effort normal and breath sounds normal. No respiratory distress. She has no wheezes.  Abdominal: Soft. Bowel sounds are normal. There is no tenderness.  Genitourinary: Vagina normal. Pelvic exam was performed with patient supine. Cervix exhibits discharge.    Nursing note and vitals reviewed.    UC Treatments / Results  Labs (all labs ordered are listed, but only abnormal results are displayed) Labs Reviewed - No data to display  EKG  EKG Interpretation None       Radiology No results  found.  Procedures Procedures (including critical care time)  Medications Ordered in UC Medications - No data to display   Initial Impression / Assessment and Plan / UC Course  I have reviewed the triage vital signs and the nursing notes.  Pertinent labs & imaging results that were available during my care of the patient were reviewed by me and considered in my medical decision making (see chart for details).  Clinical Course as of Feb 02 2050  Sat Feb 03, 2016  2049 Yeast candidiasis. Terazole prescribed. Wet prep completed. Will contact her with result. Advised to see PCP soon for prenatal care.  [KE]    Clinical Course User Index [KE] Doreene ElandKehinde T Eniola, MD      Final Clinical Impressions(s) / UC Diagnoses   Final diagnoses:  None   Candida vaginitis   New Prescriptions New Prescriptions   No medications on file     Doreene ElandKehinde T Eniola, MD 02/03/16 2052

## 2016-02-03 NOTE — ED Triage Notes (Signed)
C/o white vag d/c onset 3 days, "cottage cheese" associated w/itching, burning   Denies fevers, urinary sx, abd pain  A&O x4... NAD

## 2016-02-03 NOTE — Discharge Instructions (Signed)
It was nice seeing you today. It looks like you again have yeast infection. Please use fungal cream as instructed. We will call you with other test result. See your PCP soon for prenatal care.

## 2016-02-05 LAB — URINE CULTURE

## 2016-02-06 ENCOUNTER — Telehealth: Payer: Self-pay | Admitting: Family Medicine

## 2016-02-06 LAB — CERVICOVAGINAL ANCILLARY ONLY: Wet Prep (BD Affirm): POSITIVE — AB

## 2016-02-06 MED ORDER — CLINDAMYCIN PHOSPHATE 2 % VA CREA: 1.0000 | TOPICAL_CREAM | Freq: Every day | VAGINAL | 0 refills | Status: AC

## 2016-02-06 NOTE — Telephone Encounter (Signed)
Patient called back and is aware of results and treatment plan. Amber Haas,CMA

## 2016-02-06 NOTE — Telephone Encounter (Signed)
I called to discuss result with her but I had to leave a HIPPA compliant message for her to call back for result.   Note: Candida and BV positive on wet prep. Already given PV prescription for candida during her visit. Will E-scribe Metronidazole for BV. To inform patient to pick up med when she calls back.

## 2016-02-12 ENCOUNTER — Inpatient Hospital Stay (HOSPITAL_COMMUNITY): Payer: Medicaid Other

## 2016-02-12 ENCOUNTER — Encounter (HOSPITAL_COMMUNITY): Payer: Self-pay | Admitting: *Deleted

## 2016-02-12 ENCOUNTER — Inpatient Hospital Stay (HOSPITAL_COMMUNITY)
Admission: AD | Admit: 2016-02-12 | Discharge: 2016-02-12 | Disposition: A | Discharge status: Home / Self Care | Payer: Medicaid Other | Source: Ambulatory Visit | Attending: Obstetrics and Gynecology | Admitting: Obstetrics and Gynecology

## 2016-02-12 DIAGNOSIS — R109 Unspecified abdominal pain: Secondary | ICD-10-CM

## 2016-02-12 DIAGNOSIS — Z3A01 Less than 8 weeks gestation of pregnancy: Secondary | ICD-10-CM | POA: Diagnosis not present

## 2016-02-12 DIAGNOSIS — Z79899 Other long term (current) drug therapy: Secondary | ICD-10-CM | POA: Insufficient documentation

## 2016-02-12 DIAGNOSIS — O3680X0 Pregnancy with inconclusive fetal viability, not applicable or unspecified: Secondary | ICD-10-CM

## 2016-02-12 DIAGNOSIS — O209 Hemorrhage in early pregnancy, unspecified: Secondary | ICD-10-CM | POA: Diagnosis present

## 2016-02-12 LAB — URINE MICROSCOPIC-ADD ON: WBC, UA: NONE SEEN WBC/hpf (ref 0–5)

## 2016-02-12 LAB — WET PREP, GENITAL
Clue Cells Wet Prep HPF POC: NONE SEEN
Sperm: NONE SEEN
TRICH WET PREP: NONE SEEN
YEAST WET PREP: NONE SEEN

## 2016-02-12 LAB — CBC WITH DIFFERENTIAL/PLATELET
BASOS ABS: 0 10*3/uL (ref 0.0–0.1)
BASOS PCT: 0 %
EOS ABS: 0.1 10*3/uL (ref 0.0–0.7)
EOS PCT: 2 %
HCT: 38.5 % (ref 36.0–46.0)
Hemoglobin: 13.6 g/dL (ref 12.0–15.0)
Lymphocytes Relative: 39 %
Lymphs Abs: 2.4 10*3/uL (ref 0.7–4.0)
MCH: 31.4 pg (ref 26.0–34.0)
MCHC: 35.3 g/dL (ref 30.0–36.0)
MCV: 88.9 fL (ref 78.0–100.0)
Monocytes Absolute: 0.3 10*3/uL (ref 0.1–1.0)
Monocytes Relative: 6 %
Neutro Abs: 3.3 10*3/uL (ref 1.7–7.7)
Neutrophils Relative %: 53 %
PLATELETS: 297 10*3/uL (ref 150–400)
RBC: 4.33 MIL/uL (ref 3.87–5.11)
RDW: 12.7 % (ref 11.5–15.5)
WBC: 6.2 10*3/uL (ref 4.0–10.5)

## 2016-02-12 LAB — URINALYSIS, ROUTINE W REFLEX MICROSCOPIC
BILIRUBIN URINE: NEGATIVE
Glucose, UA: NEGATIVE mg/dL
KETONES UR: NEGATIVE mg/dL
Leukocytes, UA: NEGATIVE
NITRITE: NEGATIVE
PROTEIN: NEGATIVE mg/dL
SPECIFIC GRAVITY, URINE: 1.02 (ref 1.005–1.030)
pH: 5.5 (ref 5.0–8.0)

## 2016-02-12 LAB — HCG, QUANTITATIVE, PREGNANCY: hCG, Beta Chain, Quant, S: 4003 m[IU]/mL — ABNORMAL HIGH (ref <5)

## 2016-02-12 LAB — POCT PREGNANCY, URINE: PREG TEST UR: POSITIVE — AB

## 2016-02-12 NOTE — MAU Note (Addendum)
PT SAYS SHE  WENT   TO [facility]  2 WEEKS AGO    AND   [facility] - HAD POSITIVE UPT-  AND WAS TREATED  FOR  BV  AND YEAST.   PT  SAYS  SHE STARTED  VAG  BLEEDING  ON   Friday-    AFTER SHE STARTED YEAST MEDS -- LIGHT  PINK-  AND  NOW  HEAVIER   DARK RED.    LAST SEX-  2 WEEKS  AGO.     SAYS HAS ON PAD  BUT  NOTHING ON IT-  SEES  BLOOD  IN TOILET   WHEN GOES TO B-ROOM.    CRAMPING  STARTED TODAY-  NO MEDS.

## 2016-02-12 NOTE — Discharge Instructions (Signed)
Threatened Miscarriage °A threatened miscarriage is when you have vaginal bleeding during your first 20 weeks of pregnancy but the pregnancy has not ended. Your doctor will do tests to make sure you are still pregnant. The cause of the bleeding may not be known. This condition does not mean your pregnancy will end. It does increase the risk of it ending (complete miscarriage). °Follow these instructions at home: °· Make sure you keep all your doctor visits for prenatal care. °· Get plenty of rest. °· Do not have sex or use tampons if you have vaginal bleeding. °· Do not douche. °· Do not smoke or use drugs. °· Do not drink alcohol. °· Avoid caffeine. °Contact a doctor if: °· You have light bleeding from your vagina. °· You have belly pain or cramping. °· You have a fever. °Get help right away if: °· You have heavy bleeding from your vagina. °· You have clots of blood coming from your vagina. °· You have bad pain or cramps in your low back or belly. °· You have fever, chills, and bad belly pain. °This information is not intended to replace advice given to you by your health care provider. Make sure you discuss any questions you have with your health care provider. °Document Released: 02/15/2008 Document Revised: 08/10/2015 Document Reviewed: 12/29/2012 °Elsevier Interactive Patient Education © 2017 [employer] °Vaginal Bleeding During Pregnancy, First Trimester °A small amount of bleeding (spotting) from the vagina is common in early pregnancy. Sometimes the bleeding is normal and is not a problem, and sometimes it is a sign of something serious. Be sure to tell your doctor about any bleeding from your vagina right away. °Follow these instructions at home: °· Watch your condition for any changes. °· Follow your doctor's instructions about how active you can be. °· If you are on bed rest: °¨ You may need to stay in bed and only get up to use the bathroom. °¨ You may be allowed to do some activities. °¨ If you need  help, make plans for someone to help you. °· Write down: °¨ The number of pads you use each day. °¨ How often you change pads. °¨ How soaked (saturated) your pads are. °· Do not use tampons. °· Do not douche. °· Do not have sex or orgasms until your doctor says it is okay. °· If you pass any tissue from your vagina, save the tissue so you can show it to your doctor. °· Only take medicines as told by your doctor. °· Do not take aspirin because it can make you bleed. °· Keep all follow-up visits as told by your doctor. °Contact a doctor if: °· You bleed from your vagina. °· You have cramps. °· You have labor pains. °· You have a fever that does not go away after you take medicine. °Get help right away if: °· You have very bad cramps in your back or belly (abdomen). °· You pass large clots or tissue from your vagina. °· You bleed more. °· You feel light-headed or weak. °· You pass out (faint). °· You have chills. °· You are leaking fluid or have a gush of fluid from your vagina. °· You pass out while pooping (having a bowel movement). °This information is not intended to replace advice given to you by your health care provider. Make sure you discuss any questions you have with your health care provider. °Document Released: 07/19/2013 Document Revised: 08/10/2015 Document Reviewed: 11/09/2012 °Elsevier Interactive Patient Education © 2017 [employer]   Inc. ° °

## 2016-02-12 NOTE — MAU Provider Note (Signed)
History     CSN: [id number]  Arrival date and time: 02/12/16 [id number]   First Provider Initiated Contact with Patient 02/12/16 1940      Chief Complaint  Patient presents with  . Vaginal Bleeding   HPI Amber Haas is a 35 y.o. G6P5 at 3920w4d who presents to MAU today with complaint of vaginal bleeding. The patient states recent +HPT and LMP 12/21/15. She noted spotting 3 days ago that became heavier today. She also endorses slight lower abdominal cramping. She has not taken anything for pain. She states last intercourse 3 weeks ago. Denies N/V/D or constipation, UTI symptoms or fever.    OB History    Gravida Para Term Preterm AB Living   6 5       5    SAB TAB Ectopic Multiple Live Births         0 5      Past Medical History:  Diagnosis Date  . Hypertension     Past Surgical History:  Procedure Laterality Date  . axillary disection Left    years ago    Family History  Problem Relation Age of Onset  . Hypertension Father   . Diabetes Maternal Grandmother   . Heart disease Paternal Grandmother     Social History  Substance Use Topics  . Smoking status: Never Smoker  . Smokeless tobacco: Never Used  . Alcohol use No     Comment: occasional    Allergies: No Known Allergies  Prescriptions Prior to Admission  Medication Sig Dispense Refill Last Dose  . clindamycin (CLEOCIN) 2 % vaginal cream Place 1 Applicatorful vaginally at bedtime. 40 g 0 Past Week at Unknown time  . ibuprofen (ADVIL,MOTRIN) 200 MG tablet Take 200 mg by mouth every 6 (six) hours as needed for mild pain.    Past Week at Unknown time  . traMADol (ULTRAM) 50 MG tablet Take 1 tablet (50 mg total) by mouth every 6 (six) hours as needed. (Patient not taking: Reported on 02/12/2016) 15 tablet 0 Not Taking at Unknown time    Review of Systems  Constitutional: Negative for fever and malaise/fatigue.  Gastrointestinal: Positive for abdominal pain. Negative for constipation, diarrhea, nausea  and vomiting.  Genitourinary: Negative for dysuria, frequency and urgency.       + vaginal bleeding   Physical Exam   Blood pressure 147/96, pulse 84, temperature 98.6 F (37 C), temperature source Oral, resp. rate 20, height 5\' 2"  (1.575 m), weight 160 lb 4 oz (72.7 kg), last menstrual period 12/21/2015, unknown if currently breastfeeding.  Physical Exam  Nursing note and vitals reviewed. Constitutional: She is oriented to person, place, and time. She appears well-developed and well-nourished. No distress.  HENT:  Head: Normocephalic and atraumatic.  Cardiovascular: Normal rate.   Respiratory: Effort normal.  GI: Soft. She exhibits no distension and no mass. There is no tenderness. There is no rebound and no guarding.  Genitourinary: Uterus is enlarged (slightly). Uterus is not tender. Cervix exhibits no motion tenderness, no discharge and no friability. Right adnexum displays no mass and no tenderness. Left adnexum displays no mass and no tenderness. There is bleeding (small amount in the vaginal vault) in the vagina. No vaginal discharge found.  Neurological: She is alert and oriented to person, place, and time.  Skin: Skin is warm and dry. No erythema.  Psychiatric: She has a normal mood and affect.    Results for orders placed or performed during the hospital encounter of 02/12/16 (from the  past 24 hour(s))  Urinalysis, Routine w reflex microscopic (not at [facility])     Status: Abnormal   Collection Time: 02/12/16  7:27 PM  Result Value Ref Range   Color, Urine YELLOW YELLOW   APPearance CLEAR CLEAR   Specific Gravity, Urine 1.020 1.005 - 1.030   pH 5.5 5.0 - 8.0   Glucose, UA NEGATIVE NEGATIVE mg/dL   Hgb urine dipstick SMALL (A) NEGATIVE   Bilirubin Urine NEGATIVE NEGATIVE   Ketones, ur NEGATIVE NEGATIVE mg/dL   Protein, ur NEGATIVE NEGATIVE mg/dL   Nitrite NEGATIVE NEGATIVE   Leukocytes, UA NEGATIVE NEGATIVE  Urine microscopic-add on     Status: Abnormal   Collection Time:  02/12/16  7:27 PM  Result Value Ref Range   Squamous Epithelial / LPF 0-5 (A) NONE SEEN   WBC, UA NONE SEEN 0 - 5 WBC/hpf   RBC / HPF 6-30 0 - 5 RBC/hpf   Bacteria, UA RARE (A) NONE SEEN  Pregnancy, urine POC     Status: Abnormal   Collection Time: 02/12/16  7:29 PM  Result Value Ref Range   Preg Test, Ur POSITIVE (A) NEGATIVE  CBC with Differential/Platelet     Status: None   Collection Time: 02/12/16  7:54 PM  Result Value Ref Range   WBC 6.2 4.0 - 10.5 K/uL   RBC 4.33 3.87 - 5.11 MIL/uL   Hemoglobin 13.6 12.0 - 15.0 g/dL   HCT 16.1 09.6 - 04.5 %   MCV 88.9 78.0 - 100.0 fL   MCH 31.4 26.0 - 34.0 pg   MCHC 35.3 30.0 - 36.0 g/dL   RDW 40.9 81.1 - 91.4 %   Platelets 297 150 - 400 K/uL   Neutrophils Relative % 53 %   Neutro Abs 3.3 1.7 - 7.7 K/uL   Lymphocytes Relative 39 %   Lymphs Abs 2.4 0.7 - 4.0 K/uL   Monocytes Relative 6 %   Monocytes Absolute 0.3 0.1 - 1.0 K/uL   Eosinophils Relative 2 %   Eosinophils Absolute 0.1 0.0 - 0.7 K/uL   Basophils Relative 0 %   Basophils Absolute 0.0 0.0 - 0.1 K/uL  ABO/Rh     Status: None (Preliminary result)   Collection Time: 02/12/16  7:56 PM  Result Value Ref Range   ABO/RH(D) B POS   hCG, quantitative, pregnancy     Status: Abnormal   Collection Time: 02/12/16  7:56 PM  Result Value Ref Range   hCG, Beta Chain, Quant, S 4,003 (H) <5 mIU/mL  Wet prep, genital     Status: Abnormal   Collection Time: 02/12/16  8:00 PM  Result Value Ref Range   Yeast Wet Prep HPF POC NONE SEEN NONE SEEN   Trich, Wet Prep NONE SEEN NONE SEEN   Clue Cells Wet Prep HPF POC NONE SEEN NONE SEEN   WBC, Wet Prep HPF POC FEW (A) NONE SEEN   Sperm NONE SEEN    US Ob Comp Less 14 Wks  Result Date: 02/12/2016 CLINICAL DATA:  Vaginal bleeding. Gestational age by last menstrual period 7 weeks and 4 days.G6 P5. EXAM: OBSTETRIC <14 WK [place] AND TRANSVAGINAL OB US TECHNIQUE: Both transabdominal and transvaginal ultrasound examinations were performed for complete  evaluation of the gestation as well as the maternal uterus, adnexal regions, and pelvic cul-de-sac. Transvaginal technique was performed to assess early pregnancy. COMPARISON:  None. FINDINGS: Intrauterine gestational sac: Present with decidual reaction Yolk sac:  Not present Embryo:  Not present Cardiac Activity: Not present  MSD: 11  mm   5 w   6  d Subchorionic hemorrhage:  None visualized. Maternal uterus/adnexae: 15 mm RIGHT corpus luteal cyst. IMPRESSION: Probable early intrauterine gestational sac, but no yolk sac, fetal pole, or cardiac activity yet visualized. Recommend follow-up quantitative B-HCG levels and follow-up US in 14 days to assess viability. This recommendation follows SRU consensus guidelines: Diagnostic Criteria for Nonviable Pregnancy Early in the First Trimester. Malva Limes Engl J Med 2013; 161:0960-45; 369:1443-51. Electronically Signed   By: Awilda Metroourtnay  Bloomer M.D.   On: 02/12/2016 21:22   [place] Ob Transvaginal  Result Date: 02/12/2016 CLINICAL DATA:  Vaginal bleeding. Gestational age by last menstrual period 7 weeks and 4 days.G6 P5. EXAM: OBSTETRIC <14 WK US AND TRANSVAGINAL OB US TECHNIQUE: Both transabdominal and transvaginal ultrasound examinations were performed for complete evaluation of the gestation as well as the maternal uterus, adnexal regions, and pelvic cul-de-sac. Transvaginal technique was performed to assess early pregnancy. COMPARISON:  None. FINDINGS: Intrauterine gestational sac: Present with decidual reaction Yolk sac:  Not present Embryo:  Not present Cardiac Activity: Not present MSD: 11  mm   5 w   6  d Subchorionic hemorrhage:  None visualized. Maternal uterus/adnexae: 15 mm RIGHT corpus luteal cyst. IMPRESSION: Probable early intrauterine gestational sac, but no yolk sac, fetal pole, or cardiac activity yet visualized. Recommend follow-up quantitative B-HCG levels and follow-up US in 14 days to assess viability. This recommendation follows SRU consensus guidelines: Diagnostic Criteria  for Nonviable Pregnancy Early in the First Trimester. Malva Limes Engl J Med 2013; 409:8119-14; 369:1443-51. Electronically Signed   By: Awilda Metroourtnay  Bloomer M.D.   On: 02/12/2016 21:22    MAU Course  Procedures None  MDM +UPT UA, wet prep, GC/chlamydia, CBC, ABO/Rh, quant hCG, HIV, RPR and US today to rule out ectopic pregnancy  Assessment and Plan  A: Pregnancy of unknown location Vaginal bleeding in pregnancy prior to [redacted] weeks gestation   P: Discharge home Bleeding/ectopic precautions discussed Patient advised to follow-up with [facility] on 02/15/16 at 11:00 am for repeat labs Patient may return to MAU as needed or if her condition were to change or worsen   Marny LowensteinJulie N Wenzel, PA-C  02/12/2016, 9:33 PM

## 2016-02-13 LAB — RPR: RPR Ser Ql: NONREACTIVE

## 2016-02-13 LAB — ABO/RH: ABO/RH(D): B POS

## 2016-02-13 LAB — HIV ANTIBODY (ROUTINE TESTING W REFLEX): HIV Screen 4th Generation wRfx: NONREACTIVE

## 2016-02-14 LAB — GC/CHLAMYDIA PROBE AMP (~~LOC~~) NOT AT ARMC
Chlamydia: NEGATIVE
Neisseria Gonorrhea: NEGATIVE

## 2016-02-15 ENCOUNTER — Ambulatory Visit: Payer: Medicaid Other | Admitting: *Deleted

## 2016-02-15 DIAGNOSIS — O3680X0 Pregnancy with inconclusive fetal viability, not applicable or unspecified: Secondary | ICD-10-CM

## 2016-02-15 LAB — HCG, QUANTITATIVE, PREGNANCY: HCG, BETA CHAIN, QUANT, S: 1242 m[IU]/mL — AB (ref <5)

## 2016-02-15 NOTE — Progress Notes (Signed)
Pt in for stat hcg level. She reports that she has been bleeding and passing clots. She feels that she is having a miscarriage. Patient unable to stay for results. Will call her this afternoon with results. Pt can be reached at [phone]. Discussed results with Dr. Alysia PennaErvin. Pts hcg level is consistent with SAB. Pt should have repeat hcg level in 1 week. Called patient and left message to call back and ask to speak with me regarding her results. Pt returned call. Results given. She will return in 1 week for repeat hcg level. Patient voiced understanding and had no further questions or concerns.

## 2016-02-22 ENCOUNTER — Other Ambulatory Visit: Payer: Medicaid Other

## 2016-02-27 ENCOUNTER — Encounter (HOSPITAL_COMMUNITY): Payer: Self-pay | Admitting: Emergency Medicine

## 2016-02-27 ENCOUNTER — Emergency Department (HOSPITAL_COMMUNITY)
Admission: EM | Admit: 2016-02-27 | Discharge: 2016-02-28 | Disposition: A | Discharge status: Home / Self Care | Payer: Medicaid Other | Attending: Emergency Medicine | Admitting: Emergency Medicine

## 2016-02-27 DIAGNOSIS — R252 Cramp and spasm: Secondary | ICD-10-CM | POA: Diagnosis present

## 2016-02-27 DIAGNOSIS — I10 Essential (primary) hypertension: Secondary | ICD-10-CM | POA: Diagnosis not present

## 2016-02-27 LAB — URINALYSIS, ROUTINE W REFLEX MICROSCOPIC
Bilirubin Urine: NEGATIVE
GLUCOSE, UA: NEGATIVE mg/dL
HGB URINE DIPSTICK: NEGATIVE
Ketones, ur: NEGATIVE mg/dL
Leukocytes, UA: NEGATIVE
Nitrite: NEGATIVE
PH: 5 (ref 5.0–8.0)
PROTEIN: NEGATIVE mg/dL
SPECIFIC GRAVITY, URINE: 1.02 (ref 1.005–1.030)

## 2016-02-27 LAB — COMPREHENSIVE METABOLIC PANEL
ALBUMIN: 3.7 g/dL (ref 3.5–5.0)
ALK PHOS: 44 U/L (ref 38–126)
ALT: 21 U/L (ref 14–54)
AST: 23 U/L (ref 15–41)
Anion gap: 5 (ref 5–15)
BUN: 10 mg/dL (ref 6–20)
CALCIUM: 9.7 mg/dL (ref 8.9–10.3)
CHLORIDE: 108 mmol/L (ref 101–111)
CO2: 25 mmol/L (ref 22–32)
CREATININE: 0.79 mg/dL (ref 0.44–1.00)
GFR calc Af Amer: >60 mL/min (ref >60)
GFR calc non Af Amer: >60 mL/min (ref >60)
GLUCOSE: 100 mg/dL — AB (ref 65–99)
Potassium: 4.3 mmol/L (ref 3.5–5.1)
SODIUM: 138 mmol/L (ref 135–145)
Total Bilirubin: 0.3 mg/dL (ref 0.3–1.2)
Total Protein: 6.6 g/dL (ref 6.5–8.1)

## 2016-02-27 LAB — CBC WITH DIFFERENTIAL/PLATELET
BASOS PCT: 0 %
Basophils Absolute: 0 10*3/uL (ref 0.0–0.1)
EOS ABS: 0.2 10*3/uL (ref 0.0–0.7)
Eosinophils Relative: 3 %
HEMATOCRIT: 38.8 % (ref 36.0–46.0)
HEMOGLOBIN: 13.4 g/dL (ref 12.0–15.0)
LYMPHS ABS: 2.4 10*3/uL (ref 0.7–4.0)
Lymphocytes Relative: 43 %
MCH: 31.4 pg (ref 26.0–34.0)
MCHC: 34.5 g/dL (ref 30.0–36.0)
MCV: 90.9 fL (ref 78.0–100.0)
MONO ABS: 0.4 10*3/uL (ref 0.1–1.0)
MONOS PCT: 7 %
NEUTROS PCT: 47 %
Neutro Abs: 2.7 10*3/uL (ref 1.7–7.7)
Platelets: 321 10*3/uL (ref 150–400)
RBC: 4.27 MIL/uL (ref 3.87–5.11)
RDW: 12.9 % (ref 11.5–15.5)
WBC: 5.7 10*3/uL (ref 4.0–10.5)

## 2016-02-27 LAB — PREGNANCY, URINE: PREG TEST UR: NEGATIVE

## 2016-02-27 LAB — MAGNESIUM: Magnesium: 1.8 mg/dL (ref 1.7–2.4)

## 2016-02-27 MED ORDER — HYDROCODONE-ACETAMINOPHEN 5-325 MG PO TABS
1.0000 | ORAL_TABLET | Freq: Once | ORAL | Status: AC
  Administered 2016-02-27: 1 via ORAL
  Filled 2016-02-27: qty 1

## 2016-02-27 MED ORDER — ROPINIROLE HCL 0.25 MG PO TABS: 0.2500 mg | ORAL_TABLET | Freq: Every day | ORAL | 0 refills | Status: DC

## 2016-02-27 MED ORDER — IBUPROFEN 400 MG PO TABS
600.0000 mg | ORAL_TABLET | Freq: Once | ORAL | Status: AC
  Administered 2016-02-27: 600 mg via ORAL
  Filled 2016-02-27: qty 1

## 2016-02-27 MED ORDER — ROPINIROLE HCL 0.25 MG PO TABS
0.2500 mg | ORAL_TABLET | Freq: Once | ORAL | Status: AC
  Administered 2016-02-28: 0.25 mg via ORAL
  Filled 2016-02-27: qty 1

## 2016-02-27 NOTE — ED Provider Notes (Signed)
[facility]-EMERGENCY DEPT Provider Note   CSN: [id number] Arrival date & time: 02/27/16  1949     History   Chief Complaint Chief Complaint  Patient presents with  . Muscle Cramping / Numbness    HPI Amber Haas is a 35 y.o. female.  Pt presents to the hospital with leg cramping.  She has had these cramps for a few months.  She has tramadol and flexeril which are not helping.  She also has some numbness which started this week.  The pt denies any injury or fall.  The pt recently had a miscarriage.  She had some heavy bleeding, but that has stopped.  She did not require a D&C or medications for the miscarriage.      Past Medical History:  Diagnosis Date  . Hypertension     There are no active problems to display for this patient.   Past Surgical History:  Procedure Laterality Date  . axillary disection Left    years ago    OB History    Gravida Para Term Preterm AB Living   6 5       5    SAB TAB Ectopic Multiple Live Births         0 5       Home Medications    Prior to Admission medications   Medication Sig Start Date End Date Taking? Authorizing Provider  rOPINIRole (REQUIP) 0.25 MG tablet Take 1 tablet (0.25 mg total) by mouth at bedtime. 02/27/16   Jacalyn LefevreJulie Jash Wahlen, MD    Family History Family History  Problem Relation Age of Onset  . Hypertension Father   . Diabetes Maternal Grandmother   . Heart disease Paternal Grandmother     Social History Social History  Substance Use Topics  . Smoking status: Never Smoker  . Smokeless tobacco: Never Used  . Alcohol use No     Comment: occasional     Allergies   Patient has no known allergies.   Review of Systems Review of Systems  Gastrointestinal:       Muscle cramps  Neurological: Positive for numbness.  All other systems reviewed and are negative.    Physical Exam Updated Vital Signs BP 142/96   Pulse 79   Temp 98.6 F (37 C) (Oral)   Resp 18   Wt 158 lb 9.6 oz (71.9 kg)    LMP 02/13/2016 (Approximate)   SpO2 100%   Breastfeeding? Unknown   BMI 29.01 kg/m   Physical Exam  Constitutional: She is oriented to person, place, and time. She appears well-developed and well-nourished.  HENT:  Head: Normocephalic and atraumatic.  Right Ear: External ear normal.  Left Ear: External ear normal.  Nose: Nose normal.  Mouth/Throat: Oropharynx is clear and moist.  Eyes: Conjunctivae and EOM are normal. Pupils are equal, round, and reactive to light.  Neck: Normal range of motion. Neck supple.  Cardiovascular: Normal rate, regular rhythm, normal heart sounds and intact distal pulses.   Pulmonary/Chest: Effort normal and breath sounds normal.  Abdominal: Soft. Bowel sounds are normal.  Musculoskeletal: Normal range of motion.  Neurological: She is alert and oriented to person, place, and time.  Skin: Skin is warm.  Psychiatric: She has a normal mood and affect. Her behavior is normal. Judgment and thought content normal.  Nursing note and vitals reviewed.    ED Treatments / Results  Labs (all labs ordered are listed, but only abnormal results are displayed) Labs Reviewed  COMPREHENSIVE METABOLIC PANEL - Abnormal; Notable  for the following:       Result Value   Glucose, Bld 100 (*)    All other components within normal limits  CBC WITH DIFFERENTIAL/PLATELET  PREGNANCY, URINE  MAGNESIUM  URINALYSIS, ROUTINE W REFLEX MICROSCOPIC    EKG  EKG Interpretation None       Radiology No results found.  Procedures Procedures (including critical care time)  Medications Ordered in ED Medications  rOPINIRole (REQUIP) tablet 0.25 mg (not administered)  ibuprofen (ADVIL,MOTRIN) tablet 600 mg (600 mg Oral Given 02/27/16 2316)  HYDROcodone-acetaminophen (NORCO/VICODIN) 5-325 MG per tablet 1 tablet (1 tablet Oral Given 02/27/16 2316)     Initial Impression / Assessment and Plan / ED Course  I have reviewed the triage vital signs and the nursing  notes.  Pertinent labs & imaging results that were available during my care of the patient were reviewed by me and considered in my medical decision making (see chart for details).  Clinical Course     Labs are reassuring.  I will start pt on requip to see if that helps.  She is also given the number for ortho as she c/o back pain as well.  Final Clinical Impressions(s) / ED Diagnoses   Final diagnoses:  Muscle cramps    New Prescriptions New Prescriptions   ROPINIROLE (REQUIP) 0.25 MG TABLET    Take 1 tablet (0.25 mg total) by mouth at bedtime.     Jacalyn LefevreJulie Neiman Roots, MD 02/27/16 2350

## 2016-02-27 NOTE — ED Triage Notes (Addendum)
Pt. Reports chronic muscle aches / cramping for several months at left leg , left arm and left side of body unrelieved by prescription pain medications and Flexeril , denies injury or fall , pt. added muscle numbness this week .

## 2016-02-27 NOTE — ED Notes (Signed)
Pt c/o of numbness on her whole left side of her body.  Neuro check WDL

## 2016-02-28 ENCOUNTER — Telehealth: Payer: Self-pay | Admitting: Diagnostic Neuroimaging

## 2016-02-28 DIAGNOSIS — R200 Anesthesia of skin: Secondary | ICD-10-CM

## 2016-02-28 DIAGNOSIS — R202 Paresthesia of skin: Principal | ICD-10-CM

## 2016-02-28 DIAGNOSIS — R252 Cramp and spasm: Secondary | ICD-10-CM

## 2016-02-28 NOTE — Telephone Encounter (Signed)
Patient is calling to have an order put in for an MRI. The patient is not pregnant.

## 2016-02-28 NOTE — ED Notes (Signed)
Pt verbalized understanding discharge instructions and denies any further needs or questions at this time. VS stable, ambulatory and steady gait.   

## 2016-03-04 ENCOUNTER — Encounter: Payer: Self-pay | Admitting: Obstetrics

## 2016-03-04 ENCOUNTER — Ambulatory Visit: Payer: Medicaid Other | Admitting: Obstetrics

## 2016-03-04 ENCOUNTER — Ambulatory Visit (INDEPENDENT_AMBULATORY_CARE_PROVIDER_SITE_OTHER): Payer: Medicaid Other | Admitting: Obstetrics

## 2016-03-04 VITALS — BP 145/97 | HR 90 | Wt 158.4 lb

## 2016-03-04 DIAGNOSIS — O039 Complete or unspecified spontaneous abortion without complication: Secondary | ICD-10-CM | POA: Diagnosis not present

## 2016-03-04 DIAGNOSIS — B3731 Acute candidiasis of vulva and vagina: Secondary | ICD-10-CM

## 2016-03-04 DIAGNOSIS — B373 Candidiasis of vulva and vagina: Secondary | ICD-10-CM | POA: Diagnosis not present

## 2016-03-04 DIAGNOSIS — Z3009 Encounter for other general counseling and advice on contraception: Secondary | ICD-10-CM | POA: Diagnosis not present

## 2016-03-04 MED ORDER — FLUCONAZOLE 200 MG PO TABS: ORAL_TABLET | ORAL | 2 refills | Status: DC

## 2016-03-04 NOTE — Progress Notes (Signed)
Patient ID: Amber Haas, female   DOB: 09/08/1980, 35 y.o.   MRN: [id number]  Chief Complaint  Patient presents with  . New Patient (Initial Visit)    Patient had misscarriage three weeks ago    HPI Amber Haas is a 35 y.o. female.  Has clumpy, white vaginal discharge.  She is concerned that she gets frequent yeast and BV infections.  Wants ParaGuard IUD for contraception. HPI  Past Medical History:  Diagnosis Date  . Hypertension     Past Surgical History:  Procedure Laterality Date  . axillary disection Left    years ago    Family History  Problem Relation Age of Onset  . Hypertension Father   . Diabetes Maternal Grandmother   . Heart disease Paternal Grandmother     Social History Social History  Substance Use Topics  . Smoking status: Never Smoker  . Smokeless tobacco: Never Used  . Alcohol use No     Comment: occasional    No Known Allergies  Current Outpatient Prescriptions  Medication Sig Dispense Refill  . fluconazole (DIFLUCAN) 200 MG tablet Take 1 tablet every other day. 3 tablet 2  . ibuprofen (ADVIL,MOTRIN) 800 MG tablet Take 800 mg by mouth every 8 (eight) hours as needed for moderate pain.    Marland Kitchen. rOPINIRole (REQUIP) 0.25 MG tablet Take 1 tablet (0.25 mg total) by mouth at bedtime. 15 tablet 0   No current facility-administered medications for this visit.     Review of Systems Review of Systems Constitutional: negative for fatigue and weight loss Respiratory: negative for cough and wheezing Cardiovascular: negative for chest pain, fatigue and palpitations Gastrointestinal: negative for abdominal pain and change in bowel habits Genitourinary:clumpy, white vaginal discharge Integument/breast: negative for nipple discharge Musculoskeletal:negative for myalgias Neurological: negative for gait problems and tremors Behavioral/Psych: negative for abusive relationship, depression Endocrine: negative for temperature intolerance       Blood pressure (!) 145/97, pulse 90, weight 158 lb 6.4 oz (71.8 kg), last menstrual period 02/13/2016, not currently breastfeeding.  Physical Exam Physical Exam General:   alert  Skin:   no rash or abnormalities  Lungs:   clear to auscultation bilaterally  Heart:   regular rate and rhythm, S1, S2 normal, no murmur, click, rub or gallop  Breasts:   normal without suspicious masses, skin or nipple changes or axillary nodes  Abdomen:  normal findings: no organomegaly, soft, non-tender and no hernia  Pelvis:  External genitalia: normal general appearance Urinary system: urethral meatus normal and bladder without fullness, nontender Vaginal: normal without tenderness, induration or masses Cervix: normal appearance Adnexa: normal bimanual exam Uterus: anteverted and non-tender, normal size    50% of 15 min visit spent on counseling and coordination of care.    Data Reviewed Labs  Assessment     S/P Complete Spontaneous Abortion Contraceptive Counseling and Advice.  Wants ParaGuard IUD. Candida Vaginitis Strong FH of Diabetes and history of chronic yeast infections    Plan    Wet Prep and cultures ordered ParaGuard IUD Rx  Diflucan Rx Hemoglobin A1c ordered  Orders Placed This Encounter  Procedures  . Hemoglobin A1c   Meds ordered this encounter  Medications  . fluconazole (DIFLUCAN) 200 MG tablet    Sig: Take 1 tablet every other day.    Dispense:  3 tablet    Refill:  2

## 2016-03-04 NOTE — Addendum Note (Signed)
Addended by: Francene FindersJAMES, Symia Herdt C on: 03/04/2016 04:08 PM   Modules accepted: Orders

## 2016-03-04 NOTE — Patient Instructions (Addendum)

## 2016-03-05 LAB — HEMOGLOBIN A1C
ESTIMATED AVERAGE GLUCOSE: 97 mg/dL
Hgb A1c MFr Bld: 5 % (ref 4.8–5.6)

## 2016-03-05 NOTE — Telephone Encounter (Signed)
Pt called back inquiring if order for MRI has been put in. She was advised it was in progress

## 2016-03-06 ENCOUNTER — Telehealth: Payer: Self-pay | Admitting: *Deleted

## 2016-03-06 NOTE — Telephone Encounter (Signed)
I recommend MRI brain (without) to start. Will proceed with further testing as necessary based on results. -VRP

## 2016-03-06 NOTE — Telephone Encounter (Signed)
Spoke with patient and advised her that Dr Marjory LiesPenumalli recommends MRI brain without contrast as starting point. Then based on results, he may order additional testing as necessary. She verbalized understanding.

## 2016-03-06 NOTE — Telephone Encounter (Signed)
Received call from patient re: MRI. She questioned why MRI brain was ordered instead of her lower back. Explained that Dr Marjory LiesPenumalli is ruling out a possible demyelinating disease. He based his order on her problems, complaints, symptoms and his neurological exam of her. Stated that depending on the results, he may order MRI of cervical or lumbar spine. She verbalized understanding, then she requested MRI be done with contrast. Advised her this RN will discuss her request with Dr Lewie LoronPneumalli and call her back. She verbalized understanding, stated "I want to get to the bottom of what's causing all my problems."

## 2016-03-06 NOTE — Telephone Encounter (Signed)
Amber Haas advising patient that Dr Marjory LiesPenumalli placed order for MRI brain. She will get a call in 1-2 weeks to schedule the test. Left number for any questions.

## 2016-03-06 NOTE — Progress Notes (Signed)
Error.  Duplicate.  Correct chart has been done.  Charles A. Clearance CootsHarper MD

## 2016-03-06 NOTE — Telephone Encounter (Signed)
Mri brain ordered to rule out demyelinating disease. Then may consider mri cervical and lumbar spine in future if brain Is negative. -VRP

## 2016-03-06 NOTE — Addendum Note (Signed)
Addended byJoycelyn Schmid: Talvin Christianson on: 03/06/2016 09:53 AM   Modules accepted: Orders

## 2016-03-08 LAB — NUSWAB BV AND CANDIDA, NAA
CANDIDA ALBICANS, NAA: NEGATIVE
Candida glabrata, NAA: NEGATIVE

## 2016-03-12 ENCOUNTER — Telehealth: Payer: Self-pay | Admitting: *Deleted

## 2016-03-12 ENCOUNTER — Other Ambulatory Visit: Payer: Self-pay | Admitting: Diagnostic Neuroimaging

## 2016-03-12 NOTE — Telephone Encounter (Signed)
Pt made aware that all labs were normal from last visit.

## 2016-03-16 ENCOUNTER — Ambulatory Visit
Admission: RE | Admit: 2016-03-16 | Discharge: 2016-03-16 | Disposition: A | Discharge status: Home / Self Care | Payer: Medicaid Other | Source: Ambulatory Visit | Attending: Diagnostic Neuroimaging | Admitting: Diagnostic Neuroimaging

## 2016-03-16 DIAGNOSIS — R202 Paresthesia of skin: Principal | ICD-10-CM

## 2016-03-16 DIAGNOSIS — R252 Cramp and spasm: Secondary | ICD-10-CM

## 2016-03-16 DIAGNOSIS — R200 Anesthesia of skin: Secondary | ICD-10-CM

## 2016-03-18 NOTE — L&D Delivery Note (Signed)
Delivery Note At 10:01 AM a viable female was delivered via Vaginal, Spontaneous (Presentation:LOA compound presentation with right nuchal right arm).  APGAR: 9, 9; weight-3084.   Placenta status: Spontaneous, intact. 3VC Cord.  Cord pH: not collected  Anesthesia:  Epidural Episiotomy: None Lacerations: None Suture Repair: None Est. Blood Loss (mL):  250  Mom to postpartum.  Baby to Couplet care / Skin to Skin.  [facility] 02/19/2017, 10:23 AM

## 2016-03-20 ENCOUNTER — Telehealth: Payer: Self-pay | Admitting: *Deleted

## 2016-03-20 NOTE — Telephone Encounter (Signed)
LVM with number requesting call back re: MRI results.  

## 2016-03-20 NOTE — Telephone Encounter (Signed)
Received call back from patient. Per Dr Marjory LiesPenumalli, informed her that her MRI brain results are unremarkable. He will see her in FU to discuss. She stated she has had two recent episodes of her "back going out on her". She was unable to stand up straight for a time. She requested her FU be moved sooner and to be placed on wait list; rescheduled and placed on wait list. She verbalzied understanding, appreciation.

## 2016-04-02 ENCOUNTER — Telehealth: Payer: Self-pay | Admitting: *Deleted

## 2016-04-02 ENCOUNTER — Ambulatory Visit: Payer: Self-pay | Admitting: Diagnostic Neuroimaging

## 2016-04-02 NOTE — Telephone Encounter (Signed)
Unable to reach again. Phone immediately rang busy.

## 2016-04-02 NOTE — Telephone Encounter (Signed)
Attempted to reach patient to reschedule her follow up tomorrow due to office closing until noon re: impending bad weather. Phone immediately rang busy. Will try to reach later today.

## 2016-04-03 ENCOUNTER — Ambulatory Visit: Payer: Self-pay | Admitting: Diagnostic Neuroimaging

## 2016-04-05 NOTE — Telephone Encounter (Signed)
Attempted to reach patient for third time. Phone immediately rang busy. Called husband on DPR and left detailed message. Requested he have patient call office to reschedule her follow up.

## 2016-04-09 NOTE — Telephone Encounter (Signed)
Pt called to r/s appt. Next appt available is 3/13. She is requesting to be seen sooner. Pt [facility] although she is wanting Dr Demetrius CharityP to see her for this also

## 2016-04-10 NOTE — Telephone Encounter (Signed)
Attempted to reach patient to offer earlier FU. Left name, number.

## 2016-04-15 NOTE — Telephone Encounter (Signed)
LVM informing patient that Dr Marjory LiesPenumalli does not have any openings until the first week of March. Therefore  this RN can move her appointment up somewhat sooner. Requested she call back, left number.

## 2016-04-24 ENCOUNTER — Ambulatory Visit: Payer: Medicaid Other | Admitting: Diagnostic Neuroimaging

## 2016-04-25 ENCOUNTER — Telehealth: Payer: Self-pay | Admitting: Diagnostic Neuroimaging

## 2016-04-25 NOTE — Telephone Encounter (Signed)
Patient is calling to see if  MRI of back can be ordered.

## 2016-04-25 NOTE — Telephone Encounter (Signed)
LVM informing patient that Dr Marjory LiesPenumalli would need to see her to evaluate her prior to ordering MRI. He last saw her Nov 2017. Advised her this RN did move her FU sooner, from 3/13 to 05/17/16. Patient is still on wait list in the event a sooner FU becomes available. Advised she will get a reminder call of FU. Left office number.

## 2016-04-29 ENCOUNTER — Emergency Department (HOSPITAL_COMMUNITY)
Admission: AD | Admit: 2016-04-29 | Discharge: 2016-04-30 | Disposition: A | Discharge status: Home / Self Care | Payer: Medicaid Other | Source: Ambulatory Visit | Attending: Emergency Medicine | Admitting: Emergency Medicine

## 2016-04-29 ENCOUNTER — Other Ambulatory Visit: Payer: Self-pay

## 2016-04-29 ENCOUNTER — Telehealth: Payer: Self-pay | Admitting: Student

## 2016-04-29 ENCOUNTER — Encounter (HOSPITAL_COMMUNITY): Payer: Self-pay | Admitting: *Deleted

## 2016-04-29 DIAGNOSIS — M79605 Pain in left leg: Secondary | ICD-10-CM | POA: Insufficient documentation

## 2016-04-29 DIAGNOSIS — R0789 Other chest pain: Secondary | ICD-10-CM | POA: Diagnosis present

## 2016-04-29 DIAGNOSIS — R0602 Shortness of breath: Secondary | ICD-10-CM

## 2016-04-29 DIAGNOSIS — I10 Essential (primary) hypertension: Secondary | ICD-10-CM | POA: Diagnosis not present

## 2016-04-29 LAB — URINALYSIS, ROUTINE W REFLEX MICROSCOPIC
BACTERIA UA: NONE SEEN
Bilirubin Urine: NEGATIVE
Glucose, UA: NEGATIVE mg/dL
KETONES UR: NEGATIVE mg/dL
LEUKOCYTES UA: NEGATIVE
Nitrite: NEGATIVE
PH: 8 (ref 5.0–8.0)
Protein, ur: NEGATIVE mg/dL
Specific Gravity, Urine: 1.003 — ABNORMAL LOW (ref 1.005–1.030)

## 2016-04-29 LAB — COMPREHENSIVE METABOLIC PANEL
ALBUMIN: 4.2 g/dL (ref 3.5–5.0)
ALK PHOS: 44 U/L (ref 38–126)
ALT: 19 U/L (ref 14–54)
AST: 21 U/L (ref 15–41)
Anion gap: 9 (ref 5–15)
BUN: 7 mg/dL (ref 6–20)
CALCIUM: 9.2 mg/dL (ref 8.9–10.3)
CO2: 24 mmol/L (ref 22–32)
CREATININE: 0.85 mg/dL (ref 0.44–1.00)
Chloride: 105 mmol/L (ref 101–111)
GFR calc Af Amer: >60 mL/min (ref >60)
GFR calc non Af Amer: >60 mL/min (ref >60)
GLUCOSE: 91 mg/dL (ref 65–99)
Potassium: 3.5 mmol/L (ref 3.5–5.1)
SODIUM: 138 mmol/L (ref 135–145)
Total Bilirubin: 0.5 mg/dL (ref 0.3–1.2)
Total Protein: 7.5 g/dL (ref 6.5–8.1)

## 2016-04-29 LAB — CBC
HCT: 37.8 % (ref 36.0–46.0)
HEMOGLOBIN: 13.5 g/dL (ref 12.0–15.0)
MCH: 30.8 pg (ref 26.0–34.0)
MCHC: 35.7 g/dL (ref 30.0–36.0)
MCV: 86.3 fL (ref 78.0–100.0)
Platelets: 348 10*3/uL (ref 150–400)
RBC: 4.38 MIL/uL (ref 3.87–5.11)
RDW: 12.8 % (ref 11.5–15.5)
WBC: 5.4 10*3/uL (ref 4.0–10.5)

## 2016-04-29 LAB — POCT PREGNANCY, URINE: PREG TEST UR: NEGATIVE

## 2016-04-29 NOTE — MAU Note (Signed)
Pt saw [facility] 4weeks ago and Dr. Francesco SorPenemolie in December. Pt feels like her breathing is labored and feeling tightness in chest and pain radiatiing down her L Arm

## 2016-04-29 NOTE — MAU Note (Addendum)
PT  SAYS FEELS SOB- STARTED SAT  - NOW  WORSE.         DIZZINESS  STARTED  TODAY.      FEELS  SHAKEY,  PAIN  IN LEFT  ARM  ,  TIGHTNESS IN CHEST - STARTED     FEW MTHS AGO-   [facility] - GIVEN   A REFERRAL-       SAW  DR KINDLE,     SAW DR PENAMOLIE   ,      -  BOTH   FOUND  NOTHING      FEELS  BULGE  IN BACK OF   LEFT   KNEE .  H /A   STARTED   TODAY.          NO PAIN MEDS  TAKEN

## 2016-04-29 NOTE — Telephone Encounter (Signed)
Pt will be coming in as a new pt at the end of month. Pt states Dr. Rodolph BongAdam Kendall (ortho) would like a referral for pt to get an MRI of her back. ep

## 2016-04-29 NOTE — MAU Provider Note (Signed)
Chief Complaint: Chest Pain   First Provider Initiated Contact with Patient 04/29/16 2217     SUBJECTIVE HPI: Amber Haas is a 36 y.o. [id number] non-pregnant female who presents to [facility] reporting chest tightness and pain radiating down left arm x a few months that is worsening and SOB x ~4 days, worse w/ exertion. Also C/o having a tender mass behind her left knee. Concerned because she sits for prolonged periods at work and travel and is worried about DVT and PE.   Has seen Ortho for leg and back problems. No dopplers of leg.   Location: Chest Quality: tightening  Severity: 4/10 on pain scale Duration: few months Context: Prolonged sitting for work, recent travel Timing: intermittent Radiation: Down left arm. Feels like pulling sensation.  Modifying factors: Improves w/ rest. Worse w/ exertion.  Associated signs and symptoms: Pos for SOB, dizziness, pain in back on left knee. Neg for fever, chills, hemoptysis, wheezing.  Past Medical History:  Diagnosis Date  . Hypertension    OB History  Gravida Para Term Preterm AB Living  6 5      1 5   SAB TAB Ectopic Multiple Live Births   1     0 5    # Outcome Date GA Lbr Len/2nd Weight Sex Delivery Anes PTL Lv  6 SAB           5 Para      Vag-Spont     4 Para      Vag-Spont     3 Para      Vag-Spont     2 Para      Vag-Spont     1 Para      Vag-Spont        Past Surgical History:  Procedure Laterality Date  . axillary disection Left    years ago   Social History   Social History  . Marital status: Married    Spouse name: Risk analyst  . Number of children: 5  . Years of education: 54   Occupational History  .      home maker   Social History Main Topics  . Smoking status: Never Smoker  . Smokeless tobacco: Never Used  . Alcohol use No     Comment: occasional  . Drug use: No  . Sexual activity: Yes    Birth control/ protection: None   Other Topics Concern  . Not on file   Social History  Narrative   Lives at home with husband, children    caffeine- not every day   Family History  Problem Relation Age of Onset  . Hypertension Father   . Diabetes Maternal Grandmother   . Heart disease Paternal Grandmother    No current facility-administered medications on file prior to encounter.    Current Outpatient Prescriptions on File Prior to Encounter  Medication Sig Dispense Refill  . fluconazole (DIFLUCAN) 200 MG tablet Take 1 tablet every other day. 3 tablet 2  . ibuprofen (ADVIL,MOTRIN) 800 MG tablet Take 800 mg by mouth every 8 (eight) hours as needed for moderate pain.    Marland Kitchen rOPINIRole (REQUIP) 0.25 MG tablet Take 1 tablet (0.25 mg total) by mouth at bedtime. 15 tablet 0   No Known Allergies  I have reviewed patient's Past Medical Hx, Surgical Hx, Family Hx, Social Hx, medications and allergies.   Review of Systems  Constitutional: Negative for chills, fatigue and fever.  HENT: Negative for congestion.   Respiratory: Positive for chest tightness  and shortness of breath. Negative for cough and wheezing.   Cardiovascular: Positive for chest pain and leg swelling. Negative for palpitations.  Gastrointestinal: Negative for abdominal pain, nausea and vomiting.  Musculoskeletal: Negative for myalgias.  Neurological: Positive for dizziness. Negative for headaches.    OBJECTIVE Patient Vitals for the past 24 hrs:  BP Temp Temp src Pulse Resp SpO2 Height Weight  04/29/16 2314 - - - 73 - 100 % - -  04/29/16 2309 - - - 84 - 99 % - -  04/29/16 2304 - - - 82 - 100 % - -  04/29/16 2259 - - - 83 - 100 % - -  04/29/16 2254 - - - 95 - 100 % - -  04/29/16 2249 - - - 85 - 100 % - -  04/29/16 2244 - - - 95 - 99 % - -  04/29/16 2239 - - - 77 - 100 % - -  04/29/16 2234 - - - 80 - 100 % - -  04/29/16 2229 - - - 87 - 100 % - -  04/29/16 2224 - - - 87 - 100 % - -  04/29/16 2205 155/99 - - 85 - - - -  04/29/16 2204 154/97 - - 81 - - - -  04/29/16 2203 144/87 - - 88 - - - -  04/29/16  2159 (!) 161/102 - - 82 - - - -  04/29/16 2146 163/96 98.9 F (37.2 C) Oral 93 20 100 % 5\' 2"  (1.575 m) 160 lb 4 oz (72.7 kg)   Constitutional: Well-developed, well-nourished female in no acute distress.  Skin: No pallor or diaphoresis Cardiovascular: normal rate and rhythm. No M/R/G Respiratory: normal rate and effort. CTAB, No Wheezing, rhonchi, egophony. GI: Abd soft, non-tender. MS: Mild tenderness in back of left knee and calf. No edema, erythema, warmth, cords, normal ROM. Mild swelling in back on left knee--possible varicosity.  Neurologic: Alert and oriented x 4.  GU: Deferred  LAB RESULTS Results for orders placed or performed during the hospital encounter of 04/29/16 (from the past 24 hour(s))  Urinalysis, Routine w reflex microscopic     Status: Abnormal   Collection Time: 04/29/16  9:48 PM  Result Value Ref Range   Color, Urine STRAW (A) YELLOW   APPearance CLEAR CLEAR   Specific Gravity, Urine 1.003 (L) 1.005 - 1.030   pH 8.0 5.0 - 8.0   Glucose, UA NEGATIVE NEGATIVE mg/dL   Hgb urine dipstick LARGE (A) NEGATIVE   Bilirubin Urine NEGATIVE NEGATIVE   Ketones, ur NEGATIVE NEGATIVE mg/dL   Protein, ur NEGATIVE NEGATIVE mg/dL   Nitrite NEGATIVE NEGATIVE   Leukocytes, UA NEGATIVE NEGATIVE   RBC / HPF TOO NUMEROUS TO COUNT 0 - 5 RBC/hpf   WBC, UA 6-30 0 - 5 WBC/hpf   Bacteria, UA NONE SEEN NONE SEEN   Squamous Epithelial / LPF 0-5 (A) NONE SEEN  CBC     Status: None   Collection Time: 04/29/16 10:06 PM  Result Value Ref Range   WBC 5.4 4.0 - 10.5 K/uL   RBC 4.38 3.87 - 5.11 MIL/uL   Hemoglobin 13.5 12.0 - 15.0 g/dL   HCT 16.137.8 09.636.0 - 04.546.0 %   MCV 86.3 78.0 - 100.0 fL   MCH 30.8 26.0 - 34.0 pg   MCHC 35.7 30.0 - 36.0 g/dL   RDW 40.912.8 81.111.5 - 91.415.5 %   Platelets 348 150 - 400 K/uL  Comprehensive metabolic panel     Status: None  Collection Time: 04/29/16 10:06 PM  Result Value Ref Range   Sodium 138 135 - 145 mmol/L   Potassium 3.5 3.5 - 5.1 mmol/L   Chloride 105  101 - 111 mmol/L   CO2 24 22 - 32 mmol/L   Glucose, Bld 91 65 - 99 mg/dL   BUN 7 6 - 20 mg/dL   Creatinine, Ser 1.61 0.44 - 1.00 mg/dL   Calcium 9.2 8.9 - 09.6 mg/dL   Total Protein 7.5 6.5 - 8.1 g/dL   Albumin 4.2 3.5 - 5.0 g/dL   AST 21 15 - 41 U/L   ALT 19 14 - 54 U/L   Alkaline Phosphatase 44 38 - 126 U/L   Total Bilirubin 0.5 0.3 - 1.2 mg/dL   GFR calc non Af Amer >60 >60 mL/min   GFR calc Af Amer >60 >60 mL/min   Anion gap 9 5 - 15  Pregnancy, urine POC     Status: None   Collection Time: 04/29/16 10:06 PM  Result Value Ref Range   Preg Test, Ur NEGATIVE NEGATIVE  Troponin I     Status: None   Collection Time: 04/29/16 10:06 PM  Result Value Ref Range   Troponin I <0.03 <0.03 ng/mL  CK     Status: None   EKG NSR  IMAGING No results found.  MAU COURSE Orders Placed This Encounter  Procedures  . CBC  . Comprehensive metabolic panel  . Urinalysis, Routine w reflex microscopic  . Troponin I  . Orthostatic vital signs  . Pregnancy, urine POC  . EKG 12-Lead   MDM Discussed Hx, labs, exam w/ Dr. Alysia Penna. Requesting to transfer Pt to ED for further eval. No emergent Gyn condition present.  Agrees w/ POC. Discussed w/ Dr. Rubin Payor at [facility] ED. Accepts pt.  ASSESSMENT 1. SOB (shortness of breath)   2. Acute pain of left lower extremity   3. Hypertension, unspecified type     PLAN Transferred by [facility].  Pt stable for transfer.   [place], [place] 04/29/2016  11:33 PM

## 2016-04-30 ENCOUNTER — Emergency Department (HOSPITAL_COMMUNITY): Payer: Medicaid Other

## 2016-04-30 ENCOUNTER — Encounter (HOSPITAL_COMMUNITY): Payer: Self-pay | Admitting: Emergency Medicine

## 2016-04-30 ENCOUNTER — Other Ambulatory Visit: Payer: Self-pay

## 2016-04-30 DIAGNOSIS — M79605 Pain in left leg: Secondary | ICD-10-CM

## 2016-04-30 DIAGNOSIS — R0602 Shortness of breath: Secondary | ICD-10-CM | POA: Insufficient documentation

## 2016-04-30 DIAGNOSIS — I10 Essential (primary) hypertension: Secondary | ICD-10-CM | POA: Diagnosis not present

## 2016-04-30 DIAGNOSIS — O10919 Unspecified pre-existing hypertension complicating pregnancy, unspecified trimester: Secondary | ICD-10-CM | POA: Insufficient documentation

## 2016-04-30 LAB — TROPONIN I: Troponin I: <0.03 ng/mL (ref <0.03)

## 2016-04-30 LAB — CK: CK TOTAL: 146 U/L (ref 38–234)

## 2016-04-30 MED ORDER — IOPAMIDOL (ISOVUE-370) INJECTION 76%
INTRAVENOUS | Status: AC
  Administered 2016-04-30: 100 mL
  Filled 2016-04-30: qty 100

## 2016-04-30 MED ORDER — SODIUM CHLORIDE 0.9 % IV BOLUS (SEPSIS)
1000.0000 mL | Freq: Once | INTRAVENOUS | Status: AC
  Administered 2016-04-30: 1000 mL via INTRAVENOUS

## 2016-04-30 NOTE — ED Notes (Signed)
Patient transported to CT 

## 2016-04-30 NOTE — ED Notes (Signed)
Per [facility]  Pt is not pregnant. Pt started having CP sat center to L arm. SOB and dizziness. Approx 1 month ago calf pain. Pt has been traveling.   [facility] administered  324 ASA 1 NTG  7/10 CP.  HTN hx  150/100

## 2016-04-30 NOTE — ED Provider Notes (Signed)
[facility]-EMERGENCY DEPT Provider Note   CSN: [id number] Arrival date & time: 04/29/16  2130  By signing my name below, I, Nelwyn SalisburyJoshua Fowler, attest that this documentation has been prepared under the direction and in the presence of Tomasita CrumbleAdeleke Nastashia Gallo, MD . Electronically Signed: Nelwyn SalisburyJoshua Fowler, Scribe. 04/30/2016. 1:06 AM.  History   Chief Complaint Chief Complaint  Patient presents with  . Chest Pain   The history is provided by the patient. No language interpreter was used.    HPI Comments:  Amber Haas is a 36 y.o. female with pmhx of HTN, brought in by amulance who presents to the Emergency Department complaining of constant, chronic chest pain which began 4 months ago. Pt describes her pain as a tightness in the right side of her chest, radiating across her chest and down her left arm, which does not worsen with deep inhalation. She reports associated light-headedness, LE swelling, LLE pain and SOB, which is new and caused her to come to the ED today. Pt was given ASA and Nitroglycerin by EMS with some relief. She has been seen by neurology and orthopedics for her chest pain and LE pain with no resolution. Pt had a recent miscarriage in November 2017.    Past Medical History:  Diagnosis Date  . Hypertension     There are no active problems to display for this patient.   Past Surgical History:  Procedure Laterality Date  . axillary disection Left    years ago    OB History    Gravida Para Term Preterm AB Living   6 5       5    SAB TAB Ectopic Multiple Live Births         0 5       Home Medications    Prior to Admission medications   Medication Sig Start Date End Date Taking? Authorizing Provider  fluconazole (DIFLUCAN) 200 MG tablet Take 1 tablet every other day. Patient not taking: Reported on 04/30/2016 03/04/16   Brock Badharles A Harper, MD  rOPINIRole (REQUIP) 0.25 MG tablet Take 1 tablet (0.25 mg total) by mouth at bedtime. Patient not taking: Reported on 04/30/2016  02/27/16   Jacalyn LefevreJulie Haviland, MD    Family History Family History  Problem Relation Age of Onset  . Hypertension Father   . Diabetes Maternal Grandmother   . Heart disease Paternal Grandmother     Social History Social History  Substance Use Topics  . Smoking status: Never Smoker  . Smokeless tobacco: Never Used  . Alcohol use No     Comment: occasional     Allergies   Patient has no known allergies.   Review of Systems Review of Systems 10 Systems reviewed and are negative for acute change except as noted in the HPI.  Physical Exam Updated Vital Signs BP 136/94 (BP Location: Right Arm)   Pulse 75   Temp 98.9 F (37.2 C) (Oral)   Resp 20   Ht 5\' 2"  (1.575 m)   Wt 160 lb 4 oz (72.7 kg)   LMP 04/27/2016   SpO2 100%   BMI 29.31 kg/m   Physical Exam  Constitutional: She is oriented to person, place, and time. She appears well-developed and well-nourished. No distress.  HENT:  Head: Normocephalic and atraumatic.  Nose: Nose normal.  Mouth/Throat: Oropharynx is clear and moist. No oropharyngeal exudate.  Eyes: Conjunctivae and EOM are normal. Pupils are equal, round, and reactive to light. No scleral icterus.  Neck: Normal range of motion. Neck  supple. No JVD present. No tracheal deviation present. No thyromegaly present.  Cardiovascular: Normal rate, regular rhythm and normal heart sounds.  Exam reveals no gallop and no friction rub.   No murmur heard. Pulmonary/Chest: Effort normal and breath sounds normal. No respiratory distress. She has no wheezes. She exhibits no tenderness.  Abdominal: Soft. Bowel sounds are normal. She exhibits no distension and no mass. There is no tenderness. There is no rebound and no guarding.  Musculoskeletal: Normal range of motion. She exhibits no edema or tenderness.  Lymphadenopathy:    She has no cervical adenopathy.  Neurological: She is alert and oriented to person, place, and time. No cranial nerve deficit. She exhibits normal  muscle tone.  Skin: Skin is warm and dry. No rash noted. No erythema. No pallor.  Nursing note and vitals reviewed.    ED Treatments / Results  DIAGNOSTIC STUDIES:  Oxygen Saturation is 100% on RA, normal by my interpretation.    COORDINATION OF CARE:  1:14 AM Discussed treatment plan with pt at bedside which includes d-dimer and pt agreed to plan.  Labs (all labs ordered are listed, but only abnormal results are displayed) Labs Reviewed  URINALYSIS, ROUTINE W REFLEX MICROSCOPIC - Abnormal; Notable for the following:       Result Value   Color, Urine STRAW (*)    Specific Gravity, Urine 1.003 (*)    Hgb urine dipstick LARGE (*)    Squamous Epithelial / LPF 0-5 (*)    All other components within normal limits  CBC  COMPREHENSIVE METABOLIC PANEL  TROPONIN I  POCT PREGNANCY, URINE    EKG  EKG Interpretation None       Radiology No results found.  Procedures Procedures (including critical care time)  Medications Ordered in ED Medications - No data to display   Initial Impression / Assessment and Plan / ED Course  I have reviewed the triage vital signs and the nursing notes.  Pertinent labs & imaging results that were available during my care of the patient were reviewed by me and considered in my medical decision making (see chart for details).   Patient presents to the ED for months of CP and SOB.  She also describes an ache in her leg and recent long car drives while working for [employer].  In addition, she recently had a miscarriage 3 months ago.  [facility] of [facility].  CT scan was ordered and patient given 1L IVF.  VS are normal.  2:06 AM CT is neg for PE.  CK level normal as well.  She has seen neurology and ortho for these pains without any diagnosis.  MRI done at that time was normal as well.  She was reassured and advised to fu with PCP for further care.  She demonstrates good understanding. She appears well  and in NAD. VS remain within her normal limits and she Is safe for DC.      Final Clinical Impressions(s) / ED Diagnoses   Final diagnoses:  SOB (shortness of breath)  Acute pain of left lower extremity  Hypertension, unspecified type    New Prescriptions New Prescriptions   No medications on file    I personally performed the services described in this documentation, which was scribed in my presence. The recorded information has been reviewed and is accurate.       Tomasita Crumble, MD 04/30/16 [phone]

## 2016-05-01 MED FILL — Aspirin Chew Tab 81 MG: ORAL | Qty: 750 | Status: AC

## 2016-05-03 NOTE — Telephone Encounter (Signed)
Pt is scheduled for appointment with Dr. Alanda SlimGonfa on 05/13/2016. Routing to him. Lamonte SakaiZimmerman Rumple, April D, [place]

## 2016-05-13 ENCOUNTER — Ambulatory Visit: Payer: Medicaid Other | Admitting: Student

## 2016-05-17 ENCOUNTER — Ambulatory Visit (INDEPENDENT_AMBULATORY_CARE_PROVIDER_SITE_OTHER): Payer: Medicaid Other | Admitting: Diagnostic Neuroimaging

## 2016-05-17 ENCOUNTER — Encounter: Payer: Self-pay | Admitting: Diagnostic Neuroimaging

## 2016-05-17 VITALS — BP 133/88 | HR 77 | Wt 162.6 lb

## 2016-05-17 DIAGNOSIS — R202 Paresthesia of skin: Secondary | ICD-10-CM

## 2016-05-17 DIAGNOSIS — M62838 Other muscle spasm: Secondary | ICD-10-CM

## 2016-05-17 DIAGNOSIS — R200 Anesthesia of skin: Secondary | ICD-10-CM | POA: Diagnosis not present

## 2016-05-17 DIAGNOSIS — R252 Cramp and spasm: Secondary | ICD-10-CM | POA: Diagnosis not present

## 2016-05-17 NOTE — Progress Notes (Signed)
[facility]  PATIENT: Amber Haas DOB: 10/30/1980  REFERRING CLINICIAN:  HISTORY FROM: patient  REASON FOR VISIT: follow up    HISTORICAL  CHIEF COMPLAINT:  Chief Complaint  Patient presents with  . Back Pain    rm 7, "constant dull ache in left mid back (around scapula) and lower back; gets worse with increased activity; extends out onto L arm/shoulder; few episodes of throwing my back out-crunching/grinding and pain; seeing orthopedic drMarland Kitchen""  . Follow-up    requested sooner FU, review MRIs    HISTORY OF PRESENT ILLNESS:   UPDATE 05/17/16: Since last visit, muscle spasms are reduced, but still with left sided pain. Interested in holistic techniques. Has been doing exercise and yoga. MRI brain was normal.   PRIOR HPI (01/23/16): 36 year old right-handed female here for evaluation of shooting pains, muscle spasms, neck tightness, muscle cramps. Symptoms started December 2016 after her fifth pregnancy and delivery. Patient had normal vaginal delivery, complicated by excessive hemorrhaging, anemia and 1 extra day in the hospital. She also had slightly elevated blood pressure following the pregnancy. Ever since that time she's had intermittent waves of electrical sensation of body, spine, intermittent muscle spasms, shocks of pain, sensitivity pain. Sometimes her hands like a. Symptoms she has pain in the back for left leg. Sometimes has heart racing and rush sensation in her body. Also of note patient is late on her last menstrual cycle and may be pregnant again.   REVIEW OF SYSTEMS: Full 14 system review of systems performed and negative with exception of: only as per HPI.   ALLERGIES: No Known Allergies  HOME MEDICATIONS: No outpatient prescriptions prior to visit.   No facility-administered medications prior to visit.     PAST MEDICAL HISTORY: Past Medical History:  Diagnosis Date  . Hypertension     PAST SURGICAL HISTORY: Past Surgical  History:  Procedure Laterality Date  . axillary disection Left    years ago    FAMILY HISTORY: Family History  Problem Relation Age of Onset  . Hypertension Father   . Diabetes Maternal Grandmother   . Heart disease Paternal Grandmother     SOCIAL HISTORY:  Social History   Social History  . Marital status: Married    Spouse name: Risk analystBaBatonck  . Number of children: 5  . Years of education: 5516   Occupational History  .      home maker   Social History Main Topics  . Smoking status: Never Smoker  . Smokeless tobacco: Never Used  . Alcohol use No     Comment: occasional  . Drug use: No  . Sexual activity: Yes    Birth control/ protection: None   Other Topics Concern  . Not on file   Social History Narrative   Lives at home with husband, children    caffeine- not every day     PHYSICAL EXAM  GENERAL EXAM/CONSTITUTIONAL: Vitals:  Vitals:   05/17/16 1016  BP: 133/88  Pulse: 77  Weight: 162 lb 9.6 oz (73.8 kg)   Body mass index is 29.74 kg/m. No exam data present  Patient is in no distress; well developed, nourished and groomed; neck is supple  CARDIOVASCULAR:  Examination of carotid arteries is normal; no carotid bruits  Regular rate and rhythm, no murmurs  Examination of peripheral vascular system by observation and palpation is normal  EYES:  Ophthalmoscopic exam of optic discs and posterior segments is normal; no papilledema or hemorrhages  MUSCULOSKELETAL:  Gait, strength, tone, movements  noted in Neurologic exam below  NEUROLOGIC: MENTAL STATUS:  No flowsheet data found.  awake, alert, oriented to person, place and time  recent and remote memory intact  normal attention and concentration  language fluent, comprehension intact, naming intact,   fund of knowledge appropriate  CRANIAL NERVE:   2nd - no papilledema on fundoscopic exam  2nd, 3rd, 4th, 6th - pupils equal and reactive to light, visual fields full to confrontation,  extraocular muscles intact, no nystagmus  5th - facial sensation symmetric  7th - facial strength symmetric  8th - hearing intact  9th - palate elevates symmetrically, uvula midline  11th - shoulder shrug symmetric  12th - tongue protrusion midline  MOTOR:   normal bulk and tone, full strength in the BUE, BLE  SENSORY:   normal and symmetric to light touch  COORDINATION:   finger-nose-finger, fine finger movements normal  REFLEXES:   deep tendon reflexes present and symmetric  GAIT/STATION:   narrow based gait; romberg is negative    DIAGNOSTIC DATA (LABS, IMAGING, TESTING) - I reviewed patient records, labs, notes, testing and imaging myself where available.  Lab Results  Component Value Date   WBC 5.4 04/29/2016   HGB 13.5 04/29/2016   HCT 37.8 04/29/2016   MCV 86.3 04/29/2016   PLT 348 04/29/2016      Component Value Date/Time   NA 138 04/29/2016 2206   K 3.5 04/29/2016 2206   CL 105 04/29/2016 2206   CO2 24 04/29/2016 2206   GLUCOSE 91 04/29/2016 2206   BUN 7 04/29/2016 2206   CREATININE 0.85 04/29/2016 2206   CALCIUM 9.2 04/29/2016 2206   PROT 7.5 04/29/2016 2206   ALBUMIN 4.2 04/29/2016 2206   AST 21 04/29/2016 2206   ALT 19 04/29/2016 2206   ALKPHOS 44 04/29/2016 2206   BILITOT 0.5 04/29/2016 2206   GFRNONAA >60 04/29/2016 2206   GFRAA >60 04/29/2016 2206   No results found for: CHOL, HDL, LDLCALC, LDLDIRECT, TRIG, CHOLHDL Lab Results  Component Value Date   HGBA1C 5.0 03/04/2016   No results found for: VITAMINB12 No results found for: TSH   03/16/16 MRI brain [I reviewed images myself and agree with interpretation. -VRP]  - normal    ASSESSMENT AND PLAN  36 y.o. year old female here with constellation of symptoms of numbness, pain, electrical sensations, muscle cramps since December 2016 after pregnancy #5. MRI brain was unremarkable. Neuro exam normal.    Ddx: musculoskeletal strain, muscle spasm, cervical / lumbar  radiculopathy  1. Numbness and tingling   2. Muscle cramps   3. Muscle spasm      PLAN: I spent 15 minutes of face to face time with patient. Greater than 50% of time was spent in counseling and coordination of care with patient. In summary we discussed:  - advised conservative mgmt (PT, exercise, nutrition, massage therapy)  Return if symptoms worsen or fail to improve, for return to PCP.    Suanne Marker, MD 05/17/2016, 10:55 AM Certified in Neurology, Neurophysiology and Neuroimaging  [facility] [address] [place], [place] [address] [phone]

## 2016-05-28 ENCOUNTER — Ambulatory Visit: Payer: Self-pay | Admitting: Diagnostic Neuroimaging

## 2016-06-19 ENCOUNTER — Emergency Department (HOSPITAL_COMMUNITY): Payer: Medicaid Other

## 2016-06-19 ENCOUNTER — Inpatient Hospital Stay (HOSPITAL_COMMUNITY)
Admission: EM | Admit: 2016-06-19 | Discharge: 2016-06-21 | DRG: 781 | Disposition: A | Discharge status: Home / Self Care | Payer: Medicaid Other | Attending: Student in an Organized Health Care Education/Training Program | Admitting: Student in an Organized Health Care Education/Training Program

## 2016-06-19 ENCOUNTER — Ambulatory Visit (HOSPITAL_COMMUNITY): Payer: Medicaid Other

## 2016-06-19 ENCOUNTER — Encounter (HOSPITAL_COMMUNITY): Payer: Self-pay | Admitting: *Deleted

## 2016-06-19 DIAGNOSIS — E876 Hypokalemia: Secondary | ICD-10-CM | POA: Diagnosis not present

## 2016-06-19 DIAGNOSIS — Z331 Pregnant state, incidental: Secondary | ICD-10-CM | POA: Diagnosis not present

## 2016-06-19 DIAGNOSIS — Z833 Family history of diabetes mellitus: Secondary | ICD-10-CM | POA: Diagnosis not present

## 2016-06-19 DIAGNOSIS — H53149 Visual discomfort, unspecified: Secondary | ICD-10-CM

## 2016-06-19 DIAGNOSIS — B9689 Other specified bacterial agents as the cause of diseases classified elsewhere: Secondary | ICD-10-CM | POA: Diagnosis not present

## 2016-06-19 DIAGNOSIS — Z3490 Encounter for supervision of normal pregnancy, unspecified, unspecified trimester: Secondary | ICD-10-CM

## 2016-06-19 DIAGNOSIS — Z975 Presence of (intrauterine) contraceptive device: Secondary | ICD-10-CM

## 2016-06-19 DIAGNOSIS — R8271 Bacteriuria: Secondary | ICD-10-CM

## 2016-06-19 DIAGNOSIS — L219 Seborrheic dermatitis, unspecified: Secondary | ICD-10-CM | POA: Diagnosis present

## 2016-06-19 DIAGNOSIS — O2341 Unspecified infection of urinary tract in pregnancy, first trimester: Principal | ICD-10-CM | POA: Diagnosis present

## 2016-06-19 DIAGNOSIS — O99711 Diseases of the skin and subcutaneous tissue complicating pregnancy, first trimester: Secondary | ICD-10-CM | POA: Diagnosis present

## 2016-06-19 DIAGNOSIS — O99281 Endocrine, nutritional and metabolic diseases complicating pregnancy, first trimester: Secondary | ICD-10-CM | POA: Diagnosis present

## 2016-06-19 DIAGNOSIS — N390 Urinary tract infection, site not specified: Secondary | ICD-10-CM

## 2016-06-19 DIAGNOSIS — Z8249 Family history of ischemic heart disease and other diseases of the circulatory system: Secondary | ICD-10-CM | POA: Diagnosis not present

## 2016-06-19 DIAGNOSIS — R51 Headache: Secondary | ICD-10-CM | POA: Diagnosis not present

## 2016-06-19 DIAGNOSIS — M791 Myalgia, unspecified site: Secondary | ICD-10-CM

## 2016-06-19 DIAGNOSIS — R000 Tachycardia, unspecified: Secondary | ICD-10-CM | POA: Diagnosis not present

## 2016-06-19 DIAGNOSIS — R509 Fever, unspecified: Secondary | ICD-10-CM | POA: Diagnosis not present

## 2016-06-19 DIAGNOSIS — Z8613 Personal history of malaria: Secondary | ICD-10-CM

## 2016-06-19 DIAGNOSIS — R519 Headache, unspecified: Secondary | ICD-10-CM

## 2016-06-19 DIAGNOSIS — O0990 Supervision of high risk pregnancy, unspecified, unspecified trimester: Secondary | ICD-10-CM

## 2016-06-19 DIAGNOSIS — O161 Unspecified maternal hypertension, first trimester: Secondary | ICD-10-CM | POA: Diagnosis present

## 2016-06-19 DIAGNOSIS — Z3201 Encounter for pregnancy test, result positive: Secondary | ICD-10-CM | POA: Diagnosis present

## 2016-06-19 DIAGNOSIS — Z9189 Other specified personal risk factors, not elsewhere classified: Secondary | ICD-10-CM

## 2016-06-19 DIAGNOSIS — M542 Cervicalgia: Secondary | ICD-10-CM

## 2016-06-19 DIAGNOSIS — B9620 Unspecified Escherichia coli [E. coli] as the cause of diseases classified elsewhere: Secondary | ICD-10-CM | POA: Diagnosis present

## 2016-06-19 DIAGNOSIS — Z3A01 Less than 8 weeks gestation of pregnancy: Secondary | ICD-10-CM

## 2016-06-19 DIAGNOSIS — Z789 Other specified health status: Secondary | ICD-10-CM

## 2016-06-19 HISTORY — DX: Personal history of urinary (tract) infections: Z87.440

## 2016-06-19 HISTORY — DX: Unspecified malaria: B54

## 2016-06-19 LAB — COMPREHENSIVE METABOLIC PANEL
ALBUMIN: 3.1 g/dL — AB (ref 3.5–5.0)
ALK PHOS: 69 U/L (ref 38–126)
ALT: 46 U/L (ref 14–54)
ANION GAP: 11 (ref 5–15)
AST: 32 U/L (ref 15–41)
BUN: <5 mg/dL — ABNORMAL LOW (ref 6–20)
CALCIUM: 9 mg/dL (ref 8.9–10.3)
CO2: 22 mmol/L (ref 22–32)
Chloride: 99 mmol/L — ABNORMAL LOW (ref 101–111)
Creatinine, Ser: 0.83 mg/dL (ref 0.44–1.00)
GFR calc non Af Amer: >60 mL/min (ref >60)
GLUCOSE: 102 mg/dL — AB (ref 65–99)
Potassium: 3.3 mmol/L — ABNORMAL LOW (ref 3.5–5.1)
SODIUM: 132 mmol/L — AB (ref 135–145)
TOTAL PROTEIN: 7 g/dL (ref 6.5–8.1)
Total Bilirubin: 0.7 mg/dL (ref 0.3–1.2)

## 2016-06-19 LAB — URINALYSIS, ROUTINE W REFLEX MICROSCOPIC
Bilirubin Urine: NEGATIVE
GLUCOSE, UA: NEGATIVE mg/dL
KETONES UR: 5 mg/dL — AB
NITRITE: NEGATIVE
PH: 5 (ref 5.0–8.0)
Protein, ur: 100 mg/dL — AB
Specific Gravity, Urine: 1.014 (ref 1.005–1.030)

## 2016-06-19 LAB — INFLUENZA PANEL BY PCR (TYPE A & B)
Influenza A By PCR: NEGATIVE
Influenza B By PCR: NEGATIVE

## 2016-06-19 LAB — CBC WITH DIFFERENTIAL/PLATELET
Basophils Absolute: 0 10*3/uL (ref 0.0–0.1)
Basophils Relative: 0 %
EOS ABS: 0.1 10*3/uL (ref 0.0–0.7)
EOS PCT: 1 %
HCT: 38.8 % (ref 36.0–46.0)
Hemoglobin: 13.5 g/dL (ref 12.0–15.0)
LYMPHS ABS: 1 10*3/uL (ref 0.7–4.0)
LYMPHS PCT: 14 %
MCH: 30.3 pg (ref 26.0–34.0)
MCHC: 34.8 g/dL (ref 30.0–36.0)
MCV: 87 fL (ref 78.0–100.0)
MONOS PCT: 9 %
Monocytes Absolute: 0.6 10*3/uL (ref 0.1–1.0)
Neutro Abs: 5.3 10*3/uL (ref 1.7–7.7)
Neutrophils Relative %: 76 %
PLATELETS: 247 10*3/uL (ref 150–400)
RBC: 4.46 MIL/uL (ref 3.87–5.11)
RDW: 13.4 % (ref 11.5–15.5)
WBC: 7 10*3/uL (ref 4.0–10.5)

## 2016-06-19 LAB — PARASITE EXAM SCREEN, BLOOD-W CONF TO LABCORP (NOT @ ARMC)

## 2016-06-19 LAB — WET PREP, GENITAL
Clue Cells Wet Prep HPF POC: NONE SEEN
SPERM: NONE SEEN
TRICH WET PREP: NONE SEEN
YEAST WET PREP: NONE SEEN

## 2016-06-19 LAB — I-STAT BETA HCG BLOOD, ED (MC, WL, AP ONLY): I-stat hCG, quantitative: 593.6 m[IU]/mL — ABNORMAL HIGH (ref <5)

## 2016-06-19 LAB — HCG, SERUM, QUALITATIVE: Preg, Serum: POSITIVE — AB

## 2016-06-19 LAB — I-STAT CG4 LACTIC ACID, ED: LACTIC ACID, VENOUS: 1.39 mmol/L (ref 0.5–1.9)

## 2016-06-19 LAB — SAVE SMEAR

## 2016-06-19 MED ORDER — QUINIDINE SULFATE 300 MG PO TABS
600.0000 mg | ORAL_TABLET | Freq: Three times a day (TID) | ORAL | Status: DC
  Administered 2016-06-19 – 2016-06-21 (×5): 600 mg via ORAL
  Filled 2016-06-19 (×7): qty 2

## 2016-06-19 MED ORDER — POTASSIUM CHLORIDE CRYS ER 20 MEQ PO TBCR: 40.0000 meq | EXTENDED_RELEASE_TABLET | Freq: Once | ORAL | Status: DC

## 2016-06-19 MED ORDER — DEXTROSE 5 % IV SOLN
1.0000 g | Freq: Once | INTRAVENOUS | Status: AC
  Administered 2016-06-19: 1 g via INTRAVENOUS
  Filled 2016-06-19: qty 10

## 2016-06-19 MED ORDER — ACETAMINOPHEN 325 MG PO TABS
650.0000 mg | ORAL_TABLET | Freq: Four times a day (QID) | ORAL | Status: DC | PRN
  Administered 2016-06-19 – 2016-06-20 (×3): 650 mg via ORAL
  Filled 2016-06-19 (×3): qty 2

## 2016-06-19 MED ORDER — ACETAMINOPHEN 325 MG PO TABS
650.0000 mg | ORAL_TABLET | Freq: Once | ORAL | Status: AC
  Administered 2016-06-19: 650 mg via ORAL
  Filled 2016-06-19: qty 2

## 2016-06-19 MED ORDER — CLINDAMYCIN HCL 300 MG PO CAPS
450.0000 mg | ORAL_CAPSULE | Freq: Three times a day (TID) | ORAL | Status: DC
  Administered 2016-06-20 – 2016-06-21 (×4): 450 mg via ORAL
  Filled 2016-06-19 (×5): qty 1

## 2016-06-19 MED ORDER — ENOXAPARIN SODIUM 40 MG/0.4ML ~~LOC~~ SOLN
40.0000 mg | SUBCUTANEOUS | Status: DC
Start: 2016-06-19 — End: 2016-06-21
  Administered 2016-06-20: 40 mg via SUBCUTANEOUS
  Filled 2016-06-19 (×2): qty 0.4

## 2016-06-19 MED ORDER — OXYCODONE-ACETAMINOPHEN 5-325 MG PO TABS
1.0000 | ORAL_TABLET | Freq: Once | ORAL | Status: AC
  Administered 2016-06-19: 1 via ORAL
  Filled 2016-06-19: qty 1

## 2016-06-19 MED ORDER — ACETAMINOPHEN 650 MG RE SUPP
650.0000 mg | Freq: Four times a day (QID) | RECTAL | Status: DC | PRN
Start: 2016-06-19 — End: 2016-06-21

## 2016-06-19 MED ORDER — CEFTRIAXONE SODIUM 1 G IJ SOLR
1.0000 g | INTRAMUSCULAR | Status: DC
  Administered 2016-06-20: 1 g via INTRAVENOUS
  Filled 2016-06-19 (×2): qty 10

## 2016-06-19 MED ORDER — SODIUM CHLORIDE 0.9% FLUSH
3.0000 mL | Freq: Two times a day (BID) | INTRAVENOUS | Status: DC
  Administered 2016-06-19 – 2016-06-21 (×4): 3 mL via INTRAVENOUS

## 2016-06-19 MED ORDER — SODIUM CHLORIDE 0.9 % IV BOLUS (SEPSIS)
1000.0000 mL | Freq: Once | INTRAVENOUS | Status: AC
  Administered 2016-06-19: 1000 mL via INTRAVENOUS

## 2016-06-19 MED ORDER — SODIUM CHLORIDE 0.9 % IV SOLN
INTRAVENOUS | Status: AC
Start: 2016-06-19 — End: 2016-06-20
  Administered 2016-06-19 (×2): via INTRAVENOUS

## 2016-06-19 MED ORDER — LORATADINE 10 MG PO TABS
10.0000 mg | ORAL_TABLET | Freq: Once | ORAL | Status: AC
  Administered 2016-06-20: 10 mg via ORAL
  Filled 2016-06-19: qty 1

## 2016-06-19 MED ORDER — CLINDAMYCIN PHOSPHATE 600 MG/50ML IV SOLN
600.0000 mg | Freq: Once | INTRAVENOUS | Status: AC
  Administered 2016-06-19: 600 mg via INTRAVENOUS
  Filled 2016-06-19: qty 50

## 2016-06-19 NOTE — Consult Note (Signed)
[facility]  Date of Admission:  06/19/2016  Date of Consult:  06/19/2016  Reason for Consult: Fever in returned traveler Referring Physician: Johnson/Vincent  Impression/Recommendation Fever in Returned Traveler Pyuria IUP LMP- May 18, 2016  Would Await path review of smear. I have personally reviewed and did not see parasites.  Check serologies for dengue, yellow fever Check HIV Ab and RNA. Her LFTs do not suggest hepatitis.  Check UCx and BCx Continue ceftriaxone for pyuria I do not believe she has meningitis (she does not have photophobia on exam, she is not meningitic).  Continue empiric clinda and quinidine. It is unlikely she has drug resistant malaria that would have escaped the prev rx she was given.   Thank you so much for this interesting consult,   Bobby Rumpf (pager) [phone] [email or web address]  Amber Haas is an 36 y.o. female.  HPI: 36 yo F who had returned from [place] to visit family from 05-28-16 to 06-17-16. She did not take malaria prophylaxis. Prior to returning to [place] she was feeling unwell (fever, sweats, myalgias) and was started on anti-malarials that she received in a pharmacy.  She was previously treated for malaria in 2016. She feels the same as her previous illness.  She was given a rx for artemther/lumefantrine however she did not feel improved.  She continued to have fever and came to [facility] ED today where she was found to have temp 102.8, WBC 7 and normal h/h.  Currently she complains of neck pain, photophobia, chills.   Past Medical History:  Diagnosis Date  . Hypertension     Past Surgical History:  Procedure Laterality Date  . axillary disection Left    years ago     No Known Allergies  Medications:  Scheduled: . [START ON 06/20/2016] cefTRIAXone (ROCEPHIN)  IV  1 g Intravenous Q24H  . [START ON 06/20/2016] clindamycin  450 mg Oral Q8H  . clindamycin (CLEOCIN) IV  600 mg Intravenous  Once  . enoxaparin (LOVENOX) injection  40 mg Subcutaneous Q24H  . potassium chloride  40 mEq Oral Once  . quiNIDine sulfate  600 mg Oral Q8H  . sodium chloride flush  3 mL Intravenous Q12H    Abtx:  Anti-infectives    Start     Dose/Rate Route Frequency Ordered Stop   06/20/16 1600  cefTRIAXone (ROCEPHIN) 1 g in dextrose 5 % 50 mL IVPB     1 g 100 mL/hr over 30 Minutes Intravenous Every 24 hours 06/19/16 1912     06/20/16 0600  clindamycin (CLEOCIN) capsule 450 mg     450 mg Oral Every 8 hours 06/19/16 2042 06/26/16 2159   06/19/16 2200  clindamycin (CLEOCIN) IVPB 600 mg     600 mg 100 mL/hr over 30 Minutes Intravenous  Once 06/19/16 2042     06/19/16 1500  cefTRIAXone (ROCEPHIN) 1 g in dextrose 5 % 50 mL IVPB     1 g 100 mL/hr over 30 Minutes Intravenous  Once 06/19/16 1446 06/19/16 1634      Total days of antibiotics: 0 clinda/quinidine, ceftriaxone          Social History:  reports that she has never smoked. She has never used smokeless tobacco. She reports that she does not drink alcohol or use drugs.  Family History  Problem Relation Age of Onset  . Hypertension Father   . Diabetes Maternal Grandmother   . Heart disease Paternal Grandmother     General ROS: photophobia,  neck pain, headaches; no dysuria, no oral ulcers, normal BM, no oral ulcers, no rashes. see HPI.  Please see HPI. 12 point ROS o/w (-)'  Blood pressure 114/72, pulse 100, temperature 98.4 F (36.9 C), temperature source Oral, resp. rate 18, height 5' 2"  (1.575 m), weight 68 kg (150 lb), last menstrual period 05/18/2016, SpO2 99 %. General appearance: cooperative and mild distress Eyes: negative findings: conjunctivae and sclerae normal, pupils equal, round, reactive to light and accomodation and no photophobia Throat: lips, mucosa, and tongue normal; teeth and gums normal Neck: no adenopathy, supple, symmetrical, trachea midline and FROM with no resistance.  Lungs: clear to auscultation  bilaterally Heart: tachycardia Abdomen: normal findings: bowel sounds normal and soft, non-tender Extremities: edema none Skin: Skin color, texture, turgor normal. No rashes or lesions   Results for orders placed or performed during the hospital encounter of 06/19/16 (from the past 48 hour(s))  Comprehensive metabolic panel     Status: Abnormal   Collection Time: 06/19/16  9:33 AM  Result Value Ref Range   Sodium 132 (L) 135 - 145 mmol/L   Potassium 3.3 (L) 3.5 - 5.1 mmol/L   Chloride 99 (L) 101 - 111 mmol/L   CO2 22 22 - 32 mmol/L   Glucose, Bld 102 (H) 65 - 99 mg/dL   BUN <5 (L) 6 - 20 mg/dL   Creatinine, Ser 0.83 0.44 - 1.00 mg/dL   Calcium 9.0 8.9 - 10.3 mg/dL   Total Protein 7.0 6.5 - 8.1 g/dL   Albumin 3.1 (L) 3.5 - 5.0 g/dL   AST 32 15 - 41 U/L   ALT 46 14 - 54 U/L   Alkaline Phosphatase 69 38 - 126 U/L   Total Bilirubin 0.7 0.3 - 1.2 mg/dL   GFR calc non Af Amer >60 >60 mL/min   GFR calc Af Amer >60 >60 mL/min    Comment: (NOTE) The eGFR has been calculated using the CKD EPI equation. This calculation has not been validated in all clinical situations. eGFR's persistently <60 mL/min signify possible Chronic Kidney Disease.    Anion gap 11 5 - 15  CBC with Differential/Platelet     Status: None   Collection Time: 06/19/16  9:33 AM  Result Value Ref Range   WBC 7.0 4.0 - 10.5 K/uL   RBC 4.46 3.87 - 5.11 MIL/uL   Hemoglobin 13.5 12.0 - 15.0 g/dL   HCT 38.8 36.0 - 46.0 %   MCV 87.0 78.0 - 100.0 fL   MCH 30.3 26.0 - 34.0 pg   MCHC 34.8 30.0 - 36.0 g/dL   RDW 13.4 11.5 - 15.5 %   Platelets 247 150 - 400 K/uL   Neutrophils Relative % 76 %   Neutro Abs 5.3 1.7 - 7.7 K/uL   Lymphocytes Relative 14 %   Lymphs Abs 1.0 0.7 - 4.0 K/uL   Monocytes Relative 9 %   Monocytes Absolute 0.6 0.1 - 1.0 K/uL   Eosinophils Relative 1 %   Eosinophils Absolute 0.1 0.0 - 0.7 K/uL   Basophils Relative 0 %   Basophils Absolute 0.0 0.0 - 0.1 K/uL  hCG, serum, qualitative     Status:  Abnormal   Collection Time: 06/19/16  9:33 AM  Result Value Ref Range   Preg, Serum POSITIVE (A) NEGATIVE    Comment:        THE SENSITIVITY OF THIS METHODOLOGY IS >10 mIU/mL.   Save smear     Status: None   Collection Time: 06/19/16  9:33 AM  Result Value Ref Range   Smear Review SMEAR STAINED AND AVAILABLE FOR REVIEW   Parasite Exam Screen, Blood-w conf to [facility] (Not @ [facility])     Status: None   Collection Time: 06/19/16  9:33 AM  Result Value Ref Range   Parasite Exam Screen, Blood            Comment: No Plasmodium or other Blood Parasites seen on thin smears. Sent to Reference Laboratory for final review of thick and thin smears. RESULT CALLED TO, READ BACK BY AND VERIFIED WITH: A WHEELER,RN [id number] WILDERK   Influenza panel by PCR (type A & B)     Status: None   Collection Time: 06/19/16 10:02 AM  Result Value Ref Range   Influenza A By PCR NEGATIVE NEGATIVE   Influenza B By PCR NEGATIVE NEGATIVE    Comment: (NOTE) The Xpert Xpress Flu assay is intended as an aid in the diagnosis of  influenza and should not be used as a sole basis for treatment.  This  assay is FDA approved for nasopharyngeal swab specimens only. Nasal  washings and aspirates are unacceptable for Xpert Xpress Flu testing.   Urinalysis, Routine w reflex microscopic     Status: Abnormal   Collection Time: 06/19/16 10:17 AM  Result Value Ref Range   Color, Urine YELLOW YELLOW   APPearance HAZY (A) CLEAR   Specific Gravity, Urine 1.014 1.005 - 1.030   pH 5.0 5.0 - 8.0   Glucose, UA NEGATIVE NEGATIVE mg/dL   Hgb urine dipstick SMALL (A) NEGATIVE   Bilirubin Urine NEGATIVE NEGATIVE   Ketones, ur 5 (A) NEGATIVE mg/dL   Protein, ur 100 (A) NEGATIVE mg/dL   Nitrite NEGATIVE NEGATIVE   Leukocytes, UA SMALL (A) NEGATIVE   RBC / HPF 6-30 0 - 5 RBC/hpf   WBC, UA TOO NUMEROUS TO COUNT 0 - 5 WBC/hpf   Bacteria, UA MANY (A) NONE SEEN   Squamous Epithelial / LPF 0-5 (A) NONE SEEN   WBC Clumps PRESENT     Mucous PRESENT   I-Stat CG4 Lactic Acid, ED     Status: None   Collection Time: 06/19/16 10:20 AM  Result Value Ref Range   Lactic Acid, Venous 1.39 0.5 - 1.9 mmol/L  I-Stat Beta hCG blood, ED ([facility], [facility], [facility] only)     Status: Abnormal   Collection Time: 06/19/16 11:24 AM  Result Value Ref Range   I-stat hCG, quantitative 593.6 (H) <5 mIU/mL   Comment 3            Comment:   GEST. AGE      CONC.  (mIU/mL)   <=1 WEEK        5 - 50     2 WEEKS       50 - 500     3 WEEKS       100 - 10,000     4 WEEKS     1,000 - 30,000        FEMALE AND NON-PREGNANT FEMALE:     LESS THAN 5 mIU/mL   Wet prep, genital     Status: Abnormal   Collection Time: 06/19/16  2:18 PM  Result Value Ref Range   Yeast Wet Prep HPF POC NONE SEEN NONE SEEN   Trich, Wet Prep NONE SEEN NONE SEEN   Clue Cells Wet Prep HPF POC NONE SEEN NONE SEEN   WBC, Wet Prep HPF POC MODERATE (A) NONE SEEN  Sperm NONE SEEN       Component Value Date/Time   SDES URINE, CLEAN CATCH 02/03/2016 2030   [place] NONE 02/03/2016 2030   CULT MULTIPLE SPECIES PRESENT, SUGGEST RECOLLECTION (A) 02/03/2016 2030   REPTSTATUS 02/05/2016 FINAL 02/03/2016 2030   US Ob Comp Less 14 Wks  Result Date: 06/19/2016 CLINICAL DATA:  Pregnant. EXAM: OBSTETRIC <14 WK [place] AND TRANSVAGINAL OB US TECHNIQUE: Both transabdominal and transvaginal ultrasound examinations were performed for complete evaluation of the gestation as well as the maternal uterus, adnexal regions, and pelvic cul-de-sac. Transvaginal technique was performed to assess early pregnancy. COMPARISON:  None. FINDINGS: Intrauterine gestational sac: Single Yolk sac:  Not Visualized. Embryo:  Not Visualized. Cardiac Activity: Not Visualized. MSD: 4  mm   5 w   1  d Subchorionic hemorrhage:  None visualized. Maternal uterus/adnexae: 2.1 x 1.7 x 2.1 cm hypoechoic left ovarian mass likely reflecting corpus luteum cyst. Normal right ovary. Small amount of fluid in the lower uterine segment. Small amount  of pelvic free fluid. IMPRESSION: Probable early intrauterine gestational sac, but no yolk sac, fetal pole, or cardiac activity yet visualized. Recommend follow-up quantitative B-HCG levels and follow-up US in 14 days to assess viability. This recommendation follows SRU consensus guidelines: Diagnostic Criteria for Nonviable Pregnancy Early in the First Trimester. [place] [employer] Med 2013; 638:4536-46. Electronically Signed   By: Kathreen Devoid   On: 06/19/2016 14:03   US Ob Transvaginal  Result Date: 06/19/2016 CLINICAL DATA:  Pregnant. EXAM: OBSTETRIC <14 WK [place] AND TRANSVAGINAL OB US TECHNIQUE: Both transabdominal and transvaginal ultrasound examinations were performed for complete evaluation of the gestation as well as the maternal uterus, adnexal regions, and pelvic cul-de-sac. Transvaginal technique was performed to assess early pregnancy. COMPARISON:  None. FINDINGS: Intrauterine gestational sac: Single Yolk sac:  Not Visualized. Embryo:  Not Visualized. Cardiac Activity: Not Visualized. MSD: 4  mm   5 w   1  d Subchorionic hemorrhage:  None visualized. Maternal uterus/adnexae: 2.1 x 1.7 x 2.1 cm hypoechoic left ovarian mass likely reflecting corpus luteum cyst. Normal right ovary. Small amount of fluid in the lower uterine segment. Small amount of pelvic free fluid. IMPRESSION: Probable early intrauterine gestational sac, but no yolk sac, fetal pole, or cardiac activity yet visualized. Recommend follow-up quantitative B-HCG levels and follow-up US in 14 days to assess viability. This recommendation follows SRU consensus guidelines: Diagnostic Criteria for Nonviable Pregnancy Early in the First Trimester. [employer] Med 2013; 803:2122-48. Electronically Signed   By: Kathreen Devoid   On: 06/19/2016 14:03   Dg Chest Portable 1 View  Result Date: 06/19/2016 CLINICAL DATA:  Recent travel to [place]. Fever and aching. Malaria exposure. EXAM: PORTABLE CHEST 1 VIEW COMPARISON:  01/07/2016 FINDINGS: Heart size is normal.  Mediastinal shadows are normal. The lungs are clear. No bronchial thickening. No infiltrate, mass, effusion or collapse. Pulmonary vascularity is normal. No bony abnormality. IMPRESSION: Normal one-view chest. Electronically Signed   By: Nelson Chimes M.D.   On: 06/19/2016 09:56   Recent Results (from the past 240 hour(s))  Wet prep, genital     Status: Abnormal   Collection Time: 06/19/16  2:18 PM  Result Value Ref Range Status   Yeast Wet Prep HPF POC NONE SEEN NONE SEEN Final   Trich, Wet Prep NONE SEEN NONE SEEN Final   Clue Cells Wet Prep HPF POC NONE SEEN NONE SEEN Final   WBC, Wet Prep HPF POC MODERATE (A) NONE SEEN  Final   Sperm NONE SEEN  Final      06/19/2016, 9:34 PM     LOS: 0 days    Records and images were personally reviewed where available.

## 2016-06-19 NOTE — Progress Notes (Addendum)
Pharmacy Antibiotic Note  Amber Haas is a 36 y.o. female admitted on 06/19/2016 with UTI.  Pharmacy has been consulted for Ceftriaxone dosing.   First dose of Ceftriaxone already given in ED at ~4pm.  Urine culture sent.   Noted hx UTIs, malaria in 2016, and recent travel to [place].  ID consulted.   Tmax 102.8, WBC 7.0.   Addendum 8:46 PM :  To begin Quinidine and Cliindmycin for malaria.  Positive pregnancy test. LMP 05/18/16, first trimester.  Patient reports weight 150 lbs = 68 kg. Discussed with Dr. Laural Benes.  First dose of Clindamycin IV, then PO.  Unable to give first quinidine dose IV unless patient is in ICU for monitoring.  Will begin PO Quinidine sulfate.  Plan:  Ceftriaxone 1gm IV q24hrs.  Follow up cultures, antibiotic plans.  Addendum 8:46 PM  Clindamycin 600 mg IV x 1, then 450 mg PO q8hrs for 7 days.              (~10 mg/kg x 1 then ~20 mg/kg/day divided into 3 doses)  Quinidine sulfate 600 mg PO q8hrs x 7 days.           (~9 mg/kg sulfate = ~7.5 mg/kg quinidine base per dose)   Temp (24hrs), Avg:100.3 F (37.9 C), Min:98.4 F (36.9 C), Max:102.8 F (39.3 C)   Recent Labs Lab 06/19/16 0933 06/19/16 1020  WBC 7.0  --   CREATININE 0.83  --   LATICACIDVEN  --  1.39    Estimated crcl > 90 ml/min  No Known Allergies  Antimicrobials this admission:   Ceftriaxone 4/4>>   Clindamycin 4/4>> (4/11)   Quinidine sulfate 4/4 >> (4/11)  Dose adjustments this admission:  n/a  Microbiology results:  4/4 urine -  4/4 wet prep - negative for yeast, trich, clue cells. Positive for WBCs  Thank you for allowing pharmacy to be a part of this patient's care.  Dennie Fetters, [place] Pager: [phone] 06/19/2016 7:13 PM  Addendum: 8:48 PM

## 2016-06-19 NOTE — H&P (Signed)
Date: 06/19/2016               Patient Name:  Amber Haas MRN: [id number]  DOB: Dec 25, 1980 Age / Sex: 36 y.o., female   PCP: [facility]         Medical Service: Internal Medicine Teaching Service         Attending Physician: Dr. Erlinda Hong    First Contact: Dr. Althia Forts Pager: [phone]  Second Contact: Dr. Deneise Lever Pager: [phone]       After Hours (After 5p/  First Contact Pager: [phone]  weekends / holidays): Second Contact Pager: [phone]   Chief Complaint: Fever and myalgias  History of Present Illness: Amber Haas is a 36 y.o. woman with PMH HTN, UTIs, and Malaria in 2016 who presents for four days of progressive fevers, sweating, myalgias, and malaise that began while visiting [place]. She states that she [place], near [address], and took no antimalarial prophylaxis. She first noticed profuse sweating on March 31st and subjective fevers and chills. She visited a clinic and received an antimalarial (Lonart, artemether lumefantrine) and completed the course of this medication. Meanwhile he symptoms continued to progress and she developed diffuse myalgias (neck, back, hips), diaphoresis, palpitations, and headaches. She returned to the [place] on April 2nd and developed intermittent fevers, for which she has taken Tylenol and Ibuprofen. She endorses loss of appetite, poor po intake, frequent urination, dark urine, and minimal diarrhea. She denies nausea, vomiting, abdominal pain, chest pain, dyspnea, dysuria, URI symptoms, lymphadenopathy, or dizziness.   She reports having Malaria in 2016 despite taking prophylaxis. She was treated in [place] and was ill for 3-4 days. Her current symptoms feel similar. She has also noticed a mild painful infection of her scalp that has been present since her hair was braided in [place], and notes that her LMP was March 3rd.   In the ED her temperature  was 102.8, HR 116, RR 18, BP 128/87, and SpO2 97% and she was diaphoretic on exam. Labs were remarkable for positive urine pregnancy test, serum hCG 593, pyuria, bacteruria. Transvaginal ultrasound revealed early intrauterine gestational sac. Preliminary blood screen revealed no visible parasites. ID was consulted and IMTS was contacted for admission.  Meds:  She takes no medications  Allergies: Allergies as of 06/19/2016  . (No Known Allergies)   Past Medical History:  Diagnosis Date  . History of recurrent UTIs   . Hypertension   . Malaria    2016, treated in [place]   Family History:  Family History  Problem Relation Age of Onset  . Hypertension Father   . Diabetes Maternal Grandmother   . Heart disease Paternal Grandmother    Social History:  Social History   Social History  . Marital status: Married    Spouse name: Risk analyst  . Number of children: 5  . Years of education: 9   Occupational History  .      home maker   Social History Main Topics  . Smoking status: Never Smoker  . Smokeless tobacco: Never Used  . Alcohol use No     Comment: occasional  . Drug use: No  . Sexual activity: Yes    Birth control/ protection: None   Other Topics Concern  . Not on file   Social History Narrative   Lives at home with husband, children    caffeine- not every day    Review of Systems:  A complete ROS was negative except as per HPI.   Physical Exam: Blood pressure 125/84, pulse (!) 114, temperature (!) 102.8 F (39.3 C), temperature source Oral, resp. rate 18, last menstrual period 05/18/2016, SpO2 98 %.  General appearance: Young woman resting comfortably in bed, in mild distress, sweaty skin HENT: Normocephalic, atraumatic, moist mucous membranes, no lymphadenopathy, scalp with diffuse yellow crusting lesions, posterior neck muscles tender Eyes: PERRL, non-icteric Cardiovascular: Tachycardic rate and regular rhythm, no murmurs, rubs, gallops Respiratory: Clear  to auscultation bilaterally, normal work of breathing Abdomen: BS+, soft, non-tender, non-distended, no palpable hepatosplenomegaly Extremities: Normal bulk, no edema, 2+ peripheral pulses Skin: Warm, dry, intact Neuro: Alert and oriented, strength grossly intact Psych: Normal affect, clear speech, thoughts linear and goal-directed  Assessment & Plan by Problem: Ms. Amber Haas is a 36 yo woman with PMH HTN, UTI, Malaria admitted for suspected malaria, UTI, and also found to be pregnant.  Active Problems:   Fever   Pregnancy  Febrile illness, with myalgias, diaphoresis, palpitations, appetite loss. She reports it feels similar to malaria she has had in the past, recently traveled in [place] and did not take prophylaxis, however did complete a course of Coartem in [place] when symptoms began. Concerning for malaria, undertreated or resistant, or some arbovirus such as yellow fever, dengue, West Nile virus. -- View peripheral blood smear -- Maintenance IV fluids overnight -- ID consult, appreciate recs -- Will provide empiric coverage for malaria - clindamycin and quinidine -- Check HIV, RPR -- Obtain serology sendout for dengue and yellow fever -- Tylenol PRN for pain or fever  UTI, with urinary frequency and urine changes, dirty UA on admission -- Ceftriaxone 1g QD -- Follow urine culture  Pregnancy, hCG 593 and gestational sac seen on transvaginal US but no fetal pole or yolk sac. Patient is [id number] and was not using any contraception method.  -- Follow up hCG and [place] in 14 days -- Follow GC/chlamydia probe  Scalp infection, crusting yellowish lesions between braids, appearance most consistent with seborrheic dermatitis -- Re-evaluate in the AM  Hypokalemia, mild to 3.1 on admission -- Kdur 40 mEq once -- Trend BMP  FEN/GI: Regular diet, replete electrolytes as needed  DVT ppx: Lovenox  Code status: Full  Dispo: Admit patient to Observation with expected length of stay  less than 2 midnights.  Signed: Althia Forts, MD 06/19/2016, 2:30 PM  Pager: [phone]

## 2016-06-19 NOTE — Progress Notes (Signed)
Patient is negative for the flu, but continues to have night sweats, but no coughing or coughing up blood. Patient is unsure whether or not she has lost weight recently without trying. RN spoke with Valentino Saxon, Infection Prevention nurse who stated that if patient does have Malaria, which MDs are testing for, that patient should just be on standard precautions. RN paged Taylor,MD about whether or not patient needs to be tested for TB. Taylor,MD returned page and stated that the MDs were not planning to test the patient for TB and that it was fine to leave the patient on standard precautions. Patient is also 2-[redacted] weeks pregnant and RN asked Taylor,MD if it was safe for patient to receive lovenox as her VTE. MD instructed RN to call pharmacy. RN called pharmacy and spoke with pharmacist Twi who stated that it was perfectly safe for pregnant patients to receive lovenox. Will continue to monitor and treat per MD orders

## 2016-06-19 NOTE — ED Triage Notes (Addendum)
Pt reports recent travel to  [place], just returned on 4/2. Pt felt sick prior to leaving [place], states that she feels like she has malaria, hx of having it in past. Has fever, bodyaches, chills and headache. Mask on pt on arrival. Also reports cloudy urine and possible pregnancy. Pt is febrile at triage but does not want tylenol.

## 2016-06-19 NOTE — ED Provider Notes (Signed)
[facility]-EMER[facility]ENCY DEPT Provider Note   CSN: [id number] Arrival date & time: 06/19/16  0850     History   Chief Complaint Chief Complaint  Patient presents with  . Generalized Body Aches  . Fever    HPI Amber Haas is a 36 y.o. female.  HPI   Patient is a 36 year old female presenting with fevers body aches and malaise the last 3 days. Patient returned from [place] 3 days ago. She reports starting to feel ill prior to leaving [place]. At that time she took an antimalarial called Lonart (artemether 80+ Lumefantrine 480 )  Does not make her feeling better. Patient also noted that she's late in her period. She has had urinary symptoms as well.  She reports decreased eating and drinking. No abdominal pain.  Past Medical History:  Diagnosis Date  . Hypertension     Patient Active Problem List   Diagnosis Date Noted  . Acute pain of left lower extremity   . SOB (shortness of breath)   . Hypertension     Past Surgical History:  Procedure Laterality Date  . axillary disection Left    years ago    OB History    Gravida Para Term Preterm AB Living   SAB TAB Ectopic Multiple Live Births         0 5       Home Medications    Prior to Admission medications   Not on File    Family History Family History  Problem Relation Age of Onset  . Hypertension Father   . Diabetes Maternal Grandmother   . Heart disease Paternal Grandmother     Social History Social History  Substance Use Topics  . Smoking status: Never Smoker  . Smokeless tobacco: Never Used  . Alcohol use No     Comment: occasional     Allergies   Patient has no known allergies.   Review of Systems Review of Systems  Constitutional: Positive for chills, fatigue and fever.  HENT: Negative for congestion.   Eyes: Positive for pain.  Respiratory: Negative for cough and shortness of breath.   Cardiovascular: Negative for chest pain.  Gastrointestinal: Negative for  abdominal pain.  Musculoskeletal: Positive for arthralgias and back pain. Negative for neck pain and neck stiffness.  Neurological: Positive for light-headedness.  All other systems reviewed and are negative.    Physical Exam Updated Vital Signs BP 129/90   Pulse (!) 125   Temp (!) 102.8 F (39.3 C) (Oral)   Resp 18   LMP 05/18/2016   SpO2 97%   Physical Exam  Constitutional: She is oriented to person, place, and time. She appears well-developed and well-nourished.  Patient appears uncomfortable on exam.  HENT:  Head: Normocephalic and atraumatic.  Right Ear: External ear normal.  Left Ear: External ear normal.  Mouth/Throat: Oropharynx is clear and moist. No oropharyngeal exudate.  Eyes: EOM are normal. Pupils are equal, round, and reactive to light. Right eye exhibits no discharge.  Cardiovascular: Normal rate, regular rhythm and normal heart sounds.   No murmur heard. Pulmonary/Chest: Effort normal and breath sounds normal. She has no wheezes. She has no rales.  Abdominal: Soft. She exhibits no distension. There is no tenderness.  Neurological: She is oriented to person, place, and time.  Skin: Skin is warm. She is diaphoretic.  Psychiatric: She has a normal mood and affect.  Nursing note and vitals reviewed.    ED  Treatments / Results  Labs (all labs ordered are listed, but only abnormal results are displayed) Labs Reviewed  COMPREHENSIVE METABOLIC PANEL - Abnormal; Notable for the following:       Result Value   Sodium 132 (*)    Potassium 3.3 (*)    Chloride 99 (*)    Glucose, Bld 102 (*)    BUN <5 (*)    Albumin 3.1 (*)    All other components within normal limits  HCG, SERUM, QUALITATIVE - Abnormal; Notable for the following:    Preg, Serum POSITIVE (*)    All other components within normal limits  URINALYSIS, ROUTINE W REFLEX MICROSCOPIC - Abnormal; Notable for the following:    APPearance HAZY (*)    Hgb urine dipstick SMALL (*)    Ketones, ur 5 (*)      Protein, ur 100 (*)    Leukocytes, UA SMALL (*)    Bacteria, UA MANY (*)    Squamous Epithelial / LPF 0-5 (*)    All other components within normal limits  I-STAT BETA HCG BLOOD, ED ([facility], [facility], [facility] ONLY) - Abnormal; Notable for the following:    I-stat hCG, quantitative 593.6 (*)    All other components within normal limits  WET PREP, GENITAL  CBC WITH DIFFERENTIAL/PLATELET  SAVE SMEAR  PARASITE EXAM SCREEN, BLOOD-W CONF TO [facility] (NOT @ [facility])  INFLUENZA PANEL BY PCR (TYPE A & B)  PARASITE EXAM, BLOOD  RPR  HIV ANTIBODY (ROUTINE TESTING)  I-STAT CG4 LACTIC ACID, ED  GC/CHLAMYDIA PROBE AMP ([place]) NOT AT [facility]    EKG  EKG Interpretation None       Radiology Dg Chest Portable 1 View  Result Date: 06/19/2016 CLINICAL DATA:  Recent travel to [place]. Fever and aching. Malaria exposure. EXAM: PORTABLE CHEST 1 VIEW COMPARISON:  01/07/2016 FINDINGS: Heart size is normal. Mediastinal shadows are normal. The lungs are clear. No bronchial thickening. No infiltrate, mass, effusion or collapse. Pulmonary vascularity is normal. No bony abnormality. IMPRESSION: Normal one-view chest. Electronically Signed   By: Paulina Fusi M.D.   On: 06/19/2016 09:56    Procedures Procedures (including critical care time)  Medications Ordered in ED Medications  sodium chloride 0.9 % bolus 1,000 mL (1,000 mLs Intravenous New Bag/Given 06/19/16 1126)     Initial Impression / Assessment and Plan / ED Course  I have reviewed the triage vital signs and the nursing notes.  Pertinent labs & imaging results that were available during my care of the patient were reviewed by me and considered in my medical decision making (see chart for details).     Patient is a 36 year old female presenting with 4-5 days of fevers after return from [place]. Patient's alread taken malaria treatment, 3 days. Patient having residual symptoms. She says it feels like last time she had malaria. Patient also has noticed her  period did not come this month.  11:50 AM Peripherals smears sent and is negative for malaria. Flu is negative. Pregnancy is positive.  It is unclear to me whether this is a partially treated malarial disease therefore decreasing the burden in the peripheral smear. Dengue is not endemeic to the area and pt has no pain behind her eyes. Could consider yellow fever.  Patient does have urinary tract infection and has had symptoms. Therefore we'll treat as a  Pyelonephritis. We'll give ceftriaxone which is safe in early pregnancy.  Transvaginal ultrasound ordered.  Will admit for continued monitoring, ID consult, treatment for  pyelo..  Final Clinical Impressions(s) / ED Diagnoses   Final diagnoses:  Pregnancy    New Prescriptions New Prescriptions   No medications on file     Dacota Devall Randall An, MD 06/20/16 [phone]

## 2016-06-19 NOTE — ED Notes (Signed)
Patient transported to Ultrasound 

## 2016-06-20 DIAGNOSIS — O2341 Unspecified infection of urinary tract in pregnancy, first trimester: Secondary | ICD-10-CM | POA: Diagnosis present

## 2016-06-20 DIAGNOSIS — L219 Seborrheic dermatitis, unspecified: Secondary | ICD-10-CM

## 2016-06-20 DIAGNOSIS — R51 Headache: Secondary | ICD-10-CM

## 2016-06-20 DIAGNOSIS — M791 Myalgia, unspecified site: Secondary | ICD-10-CM

## 2016-06-20 DIAGNOSIS — R519 Headache, unspecified: Secondary | ICD-10-CM

## 2016-06-20 DIAGNOSIS — O161 Unspecified maternal hypertension, first trimester: Secondary | ICD-10-CM | POA: Diagnosis present

## 2016-06-20 DIAGNOSIS — Z9189 Other specified personal risk factors, not elsewhere classified: Secondary | ICD-10-CM | POA: Diagnosis not present

## 2016-06-20 DIAGNOSIS — O99281 Endocrine, nutritional and metabolic diseases complicating pregnancy, first trimester: Secondary | ICD-10-CM | POA: Diagnosis present

## 2016-06-20 DIAGNOSIS — Z9889 Other specified postprocedural states: Secondary | ICD-10-CM

## 2016-06-20 DIAGNOSIS — R509 Fever, unspecified: Secondary | ICD-10-CM | POA: Diagnosis present

## 2016-06-20 DIAGNOSIS — R8271 Bacteriuria: Secondary | ICD-10-CM | POA: Diagnosis not present

## 2016-06-20 DIAGNOSIS — Z3490 Encounter for supervision of normal pregnancy, unspecified, unspecified trimester: Secondary | ICD-10-CM | POA: Diagnosis present

## 2016-06-20 DIAGNOSIS — B9620 Unspecified Escherichia coli [E. coli] as the cause of diseases classified elsewhere: Secondary | ICD-10-CM | POA: Diagnosis present

## 2016-06-20 DIAGNOSIS — N390 Urinary tract infection, site not specified: Secondary | ICD-10-CM | POA: Diagnosis not present

## 2016-06-20 DIAGNOSIS — B9689 Other specified bacterial agents as the cause of diseases classified elsewhere: Secondary | ICD-10-CM | POA: Diagnosis not present

## 2016-06-20 DIAGNOSIS — E876 Hypokalemia: Secondary | ICD-10-CM

## 2016-06-20 DIAGNOSIS — R112 Nausea with vomiting, unspecified: Secondary | ICD-10-CM | POA: Diagnosis not present

## 2016-06-20 DIAGNOSIS — Z331 Pregnant state, incidental: Secondary | ICD-10-CM

## 2016-06-20 DIAGNOSIS — Z8613 Personal history of malaria: Secondary | ICD-10-CM | POA: Diagnosis not present

## 2016-06-20 DIAGNOSIS — O99711 Diseases of the skin and subcutaneous tissue complicating pregnancy, first trimester: Secondary | ICD-10-CM | POA: Diagnosis present

## 2016-06-20 DIAGNOSIS — Z8249 Family history of ischemic heart disease and other diseases of the circulatory system: Secondary | ICD-10-CM | POA: Diagnosis not present

## 2016-06-20 DIAGNOSIS — Z3201 Encounter for pregnancy test, result positive: Secondary | ICD-10-CM | POA: Diagnosis present

## 2016-06-20 DIAGNOSIS — Z3A01 Less than 8 weeks gestation of pregnancy: Secondary | ICD-10-CM | POA: Diagnosis not present

## 2016-06-20 LAB — CSF CELL COUNT WITH DIFFERENTIAL
RBC Count, CSF: 19 /mm3 — ABNORMAL HIGH
TUBE #: 1
WBC, CSF: 1 /mm3 (ref 0–5)

## 2016-06-20 LAB — PARASITE EXAM SCREEN, BLOOD-W CONF TO LABCORP (NOT @ ARMC)

## 2016-06-20 LAB — GC/CHLAMYDIA PROBE AMP (~~LOC~~) NOT AT ARMC
Chlamydia: NEGATIVE
NEISSERIA GONORRHEA: NEGATIVE

## 2016-06-20 LAB — PROCALCITONIN: PROCALCITONIN: 0.29 ng/mL

## 2016-06-20 LAB — PROTEIN AND GLUCOSE, CSF
GLUCOSE CSF: 62 mg/dL (ref 40–70)
TOTAL PROTEIN, CSF: 25 mg/dL (ref 15–45)

## 2016-06-20 LAB — HIV ANTIBODY (ROUTINE TESTING W REFLEX): HIV SCREEN 4TH GENERATION: NONREACTIVE

## 2016-06-20 LAB — GLUCOSE, CAPILLARY: GLUCOSE-CAPILLARY: 82 mg/dL (ref 65–99)

## 2016-06-20 LAB — CRYPTOCOCCAL ANTIGEN, CSF: Crypto Ag: NEGATIVE

## 2016-06-20 LAB — RPR: RPR Ser Ql: NONREACTIVE

## 2016-06-20 MED ORDER — ACETAMINOPHEN 500 MG PO TABS
500.0000 mg | ORAL_TABLET | Freq: Once | ORAL | Status: AC
  Administered 2016-06-20: 500 mg via ORAL
  Filled 2016-06-20: qty 1

## 2016-06-20 MED ORDER — ONDANSETRON 4 MG PO TBDP
4.0000 mg | ORAL_TABLET | Freq: Three times a day (TID) | ORAL | Status: DC | PRN
  Administered 2016-06-20 – 2016-06-21 (×2): 4 mg via ORAL
  Filled 2016-06-20 (×3): qty 1

## 2016-06-20 MED ORDER — LORATADINE 10 MG PO TABS
10.0000 mg | ORAL_TABLET | Freq: Every day | ORAL | Status: DC
  Administered 2016-06-21: 10 mg via ORAL
  Filled 2016-06-20 (×2): qty 1

## 2016-06-20 NOTE — Progress Notes (Signed)
Patient requested antihistamine to take with quinidine because the quinidine drug causes the patient to start shivering according to the patient. Taylor,MD paged and he placed order for Claritin. After  dose of PRN tylenol patient's temperature went up to 103 rectally. Taylor,MD notified and placed order for additional dose of  of tylenol. RN also applied ice packs to patient's arm pits and neck. Patient's current temp rectally is 101.4. Will continue to monitor and treat per MD orders.

## 2016-06-20 NOTE — Progress Notes (Signed)
Subjective: Amber Haas feels somewhat better today, reporting that her myalgias and headache have significantly improved. She has remained persistently febrile overnight, Tmax 103 around midnight. She states the pain medications are helping with he discomfort but she is experiencing persistent chills, and now some nausea this morning. We discussed our concern with her that bacterial meningitis remains a possibility and she is amenable to undergo LP today.   She also reports some nausea with her Quinidine medication and threw up her morning dose.   Objective: Vital signs in last 24 hours: Vitals:   06/20/16 0233 06/20/16 0335 06/20/16 0507 06/20/16 0508  BP:   106/69   Pulse:   97   Resp:   16   Temp: (!) 101.4 F (38.6 C) (!) 100.6 F (38.1 C)  98.6 F (37 C)  TempSrc: Rectal Rectal  Oral  SpO2:   100%   Weight:      Height:        Intake/Output Summary (Last 24 hours) at 06/20/16 1025 Last data filed at 06/19/16 2139  Gross per 24 hour  Intake             1003 ml  Output                0 ml  Net             1003 ml    Physical Exam General appearance: Young woman resting comfortably in bed, in mild distress, sweaty skin HENT: Normocephalic, atraumatic, moist mucous membranes, no lymphadenopathy, scalp with scattered yellow crusting lesions Eyes: PERRL, non-icteric Cardiovascular: Tachycardic rate and regular rhythm, no murmurs, rubs, gallops Respiratory: Clear to auscultation bilaterally, normal work of breathing Abdomen: BS+, soft, non-tender, non-distended, no palpable hepatosplenomegaly Extremities: Normal bulk, no edema, 2+ peripheral pulses Skin: Warm, dry, intact Neuro: Alert and oriented, strength grossly intact Psych: Normal affect, clear speech, thoughts linear and goal-directed  Labs / Imaging / Procedures: CBC Latest Ref Rng & Units 06/19/2016 04/29/2016 02/27/2016  WBC 4.0 - 10.5 K/uL 7.0 5.4 5.7  Hemoglobin 12.0 - 15.0 g/dL 16.1 09.6 04.5    Hematocrit 36.0 - 46.0 % 38.8 37.8 38.8  Platelets 150 - 400 K/uL 247 348 321   BMP Latest Ref Rng & Units 06/19/2016 04/29/2016 02/27/2016  Glucose 65 - 99 mg/dL 409(W) 91 119(J)  BUN 6 - 20 mg/dL <4(N) 7 10  Creatinine 0.44 - 1.00 mg/dL 8.29 5.62 1.30  Sodium 135 - 145 mmol/L 132(L) 138 138  Potassium 3.5 - 5.1 mmol/L 3.3(L) 3.5 4.3  Chloride 101 - 111 mmol/L 99(L) 105 108  CO2 22 - 32 mmol/L Calcium 8.9 - 10.3 mg/dL 9.0 9.2 9.7    Assessment/Plan: Amber Haas is a 36 yo woman with PMH HTN, UTI, Malaria admitted for suspected malaria, UTI, and also found to be pregnant.   Active Problems:   Fever   Pregnancy  Febrile illness, with myalgias, diaphoresis, palpitations, headache, appetite loss. Feels similar to previous bout of malaria, she recently traveled in [place] and did not take prophylaxis, however did complete a 3d course of Coartem in [place] when symptoms began. Concerning for malaria vs other arbovirus such as yellow fever, dengue, West Nile virus. Continues to have persistent fevers and headache which is concerning for the possibility of bacterial meningitis. Some symptom improvement today on clinda, ceftriaxone, and quinidine but may be partially treating a meningitis. -- LP today to obtain CSF gs/cx, cell count/diff, protein/glucose, HSV pcr, and  crypto Ag -- Check procalcitonin -- ID consult, appreciate recs -- PBS reviewed and no visible parasites -- Will provide empiric coverage for malaria - Clindamycin and Quinidine -- Zofran-ODT prn for nausea -- Follow sendout for dengue and yellow fever -- Tylenol PRN for pain or fever  UTI, with urinary frequency, largely asymptomatic, dirty UA on admission. Needs treatment in setting of pregnancy. -- Ceftriaxone 1g QD -- Follow urine culture - positive >100k GNRs  Pregnancy, hCG 593 and gestational sac seen on transvaginal US but no fetal pole or yolk sac. Patient is [id number] and was not using any  contraception method.  -- Follow up hCG and [place] in 14 days -- Follow GC/chlamydia probe  Seborrheic dermatitits, moderate crusting yellowish lesions between braids -- Continue to monitor  Hypokalemia, mild to 3.1 on admission -- Received Kdur 40 mEq once  FEN/GI: Regular diet, replete electrolytes as needed  Dispo: Anticipated discharge in approximately 2-3 day(s).   LOS: 0 days   Althia Forts, MD 06/20/2016, 10:25 AM Pager: [phone]

## 2016-06-20 NOTE — Progress Notes (Signed)
Lumbar Puncture Procedure Note  Pre-operative Diagnosis:  Febrile illness with headache  Indications:  Fever, headache, neck stiffness  Procedure Details:  Informed consent was obtained after explanation of the risks and benefits of the procedure, refer to the consent documentation.  Time-out performed immediately prior to the procedure.  Patient was placed in sitting position leaning forward.  The superior aspect of the iliac crests were identified, with the traverse demarcating the L4-L5 interspace.  A bedside ultrasound was used to help identify the spinous processes at L4 - L5 and the intervertebral space was located and marked.  This area was prepped and draped in the usual sterile fashion. Maximum sterile technique was used including antiseptics, cap, gloves, gown, hand hygiene, mask, and sterile sheet.  Local anesthesia with 1% lidocaine was applied subcutaneously then deep to the skin. The spinal needle with trocar was introduced with frequent removal of the trocar to evaluate for cerebrospinal fluid. When this fluid was noted an opening pressure was obtained and samples were collected in four separate tubes and sent to the lab after proper labeling. The spinal needle with trocar was removed, with minimal bleeding noted upon removal. A sterile bandage was placed over the puncture site after holding pressure.  A spinal needle was inserted at the L4 - L5 interspace.   Findings: 14 mL of clear spinal fluid was obtained. Tube 1, 2, 3, 4 were sent for cell count with differential, gram stain and culture, protein and glucose, HSV pcr, cryptococcal antigen.  Opening Pressure: Not obtained Closing Pressure: Not obtained        Condition:   The patient tolerated the procedure well and remains in the same condition as pre-procedure.  Complications: None; patient tolerated the procedure well.  Plan: Pt to remain supine for 1 hour.

## 2016-06-20 NOTE — Progress Notes (Addendum)
Subjective:   Still fevers and chills and myalgias, headaches  Antibiotics:  Anti-infectives    Start     Dose/Rate Route Frequency Ordered Stop   06/20/16 1600  cefTRIAXone (ROCEPHIN) 1 g in dextrose 5 % 50 mL IVPB     1 g 100 mL/hr over 30 Minutes Intravenous Every 24 hours 06/19/16 1912     06/20/16 0600  clindamycin (CLEOCIN) capsule 450 mg     450 mg Oral Every 8 hours 06/19/16 2042 06/26/16 2159   06/19/16 2200  clindamycin (CLEOCIN) IVPB 600 mg     600 mg 100 mL/hr over 30 Minutes Intravenous  Once 06/19/16 2042 06/19/16 2202   06/19/16 1500  cefTRIAXone (ROCEPHIN) 1 g in dextrose 5 % 50 mL IVPB     1 g 100 mL/hr over 30 Minutes Intravenous  Once 06/19/16 1446 06/19/16 1634      Medications: Scheduled Meds: . cefTRIAXone (ROCEPHIN)  IV  1 g Intravenous Q24H  . clindamycin  450 mg Oral Q8H  . enoxaparin (LOVENOX) injection  40 mg Subcutaneous Q24H  . loratadine  10 mg Oral Daily  . potassium chloride  40 mEq Oral Once  . quiNIDine sulfate  600 mg Oral Q8H  . sodium chloride flush  3 mL Intravenous Q12H   Continuous Infusions: PRN Meds:.acetaminophen **OR** acetaminophen, ondansetron    Objective: Weight change:   Intake/Output Summary (Last 24 hours) at 06/20/16 1739 Last data filed at 06/20/16 1455  Gross per 24 hour  Intake              123 ml  Output                1 ml  Net              122 ml   Blood pressure 106/69, pulse 97, temperature (!) 101.8 F (38.8 C), temperature source Oral, resp. rate 16, height  (1.575 m), weight 150 lb (68 kg), last menstrual period 05/18/2016, SpO2 100 %. Temp:  [98.4 F (36.9 C)-103 F (39.4 C)] 101.8 F (38.8 C) (04/05 1033) Pulse Rate:  [97-117] 97 (04/05 0507) Resp:  [16-18] 16 (04/05 0507) BP: (106-131)/(69-75) 106/69 (04/05 0507) SpO2:  [99 %-100 %] 100 % (04/05 0507) Weight:  [150 lb (68 kg)] 150 lb (68 kg) (04/04 2000)  Physical Exam: General: Alert and awake, oriented x3, not in any  acute distress shivering HEENT: anicteric sclera, pupils reactive to light and accommodation, EOMI CVS regular rate, normal r,  no murmur rubs or gallops Chest: clear to auscultation bilaterally, no wheezing, rales or rhonchi Abdomen: soft nontender, nondistended, normal bowel sounds, Extremities: no  clubbing or edema noted bilaterally Skin: no rashes Neuro: nonfocal  CBC:  CBC Latest Ref Rng & Units 06/19/2016 04/29/2016 02/27/2016  WBC 4.0 - 10.5 K/uL 7.0 5.4 5.7  Hemoglobin 12.0 - 15.0 g/dL 40.9 81.1 91.4  Hematocrit 36.0 - 46.0 % 38.8 37.8 38.8  Platelets 150 - 400 K/uL 247 348 321      BMET  Recent Labs  06/19/16 0933  NA 132*  K 3.3*  CL 99*  CO2 22  GLUCOSE 102*  BUN <5*  CREATININE 0.83  CALCIUM 9.0     Liver Panel   Recent Labs  06/19/16 0933  PROT 7.0  ALBUMIN 3.1*  AST 32  ALT 46  ALKPHOS 69  BILITOT 0.7       Sedimentation Rate No results for input(s): ESRSEDRATE  in the last 72 hours. C-Reactive Protein No results for input(s): CRP in the last 72 hours.  Micro Results: Recent Results (from the past 720 hour(s))  Culture, Urine     Status: Abnormal (Preliminary result)   Collection Time: 06/19/16 10:15 AM  Result Value Ref Range Status   Specimen Description URINE, RANDOM  Final   Special Requests NONE  Final   Culture (A)  Final    >=100,000 COLONIES/mL ESCHERICHIA COLI SUSCEPTIBILITIES TO FOLLOW    Report Status PENDING  Incomplete  Wet prep, genital     Status: Abnormal   Collection Time: 06/19/16  2:18 PM  Result Value Ref Range Status   Yeast Wet Prep HPF POC NONE SEEN NONE SEEN Final   Trich, Wet Prep NONE SEEN NONE SEEN Final   Clue Cells Wet Prep HPF POC NONE SEEN NONE SEEN Final   WBC, Wet Prep HPF POC MODERATE (A) NONE SEEN Final   Sperm NONE SEEN  Final  Culture, blood (Routine X 2) w Reflex to ID Panel     Status: None (Preliminary result)   Collection Time: 06/20/16 10:24 AM  Result Value Ref Range Status    Specimen Description BLOOD RIGHT ANTECUBITAL  Final   Special Requests IN PEDIATRIC BOTTLE Blood Culture adequate volume  Final   Culture NO GROWTH < 12 HOURS  Final   Report Status PENDING  Incomplete  Culture, blood (Routine X 2) w Reflex to ID Panel     Status: None (Preliminary result)   Collection Time: 06/20/16 10:26 AM  Result Value Ref Range Status   Specimen Description BLOOD RIGHT HAND  Final   Special Requests IN PEDIATRIC BOTTLE Blood Culture adequate volume  Final   Culture NO GROWTH < 12 HOURS  Final   Report Status PENDING  Incomplete  CSF culture with Stat gram stain     Status: None (Preliminary result)   Collection Time: 06/20/16  1:25 PM  Result Value Ref Range Status   Specimen Description BACK  Final   Special Requests NONE  Final   Gram Stain   Final    CYTOSPIN SMEAR WBC PRESENT, PREDOMINANTLY MONONUCLEAR NO ORGANISMS SEEN    Culture PENDING  Incomplete   Report Status PENDING  Incomplete    Studies/Results: US Ob Comp Less 14 Wks  Result Date: 06/19/2016 CLINICAL DATA:  Pregnant. EXAM: OBSTETRIC <14 WK [place] AND TRANSVAGINAL OB US TECHNIQUE: Both transabdominal and transvaginal ultrasound examinations were performed for complete evaluation of the gestation as well as the maternal uterus, adnexal regions, and pelvic cul-de-sac. Transvaginal technique was performed to assess early pregnancy. COMPARISON:  None. FINDINGS: Intrauterine gestational sac: Single Yolk sac:  Not Visualized. Embryo:  Not Visualized. Cardiac Activity: Not Visualized. MSD: 4  mm   5 w   1  d Subchorionic hemorrhage:  None visualized. Maternal uterus/adnexae: 2.1 x 1.7 x 2.1 cm hypoechoic left ovarian mass likely reflecting corpus luteum cyst. Normal right ovary. Small amount of fluid in the lower uterine segment. Small amount of pelvic free fluid. IMPRESSION: Probable early intrauterine gestational sac, but no yolk sac, fetal pole, or cardiac activity yet visualized. Recommend follow-up  quantitative B-HCG levels and follow-up US in 14 days to assess viability. This recommendation follows SRU consensus guidelines: Diagnostic Criteria for Nonviable Pregnancy Early in the First Trimester. Malva Limes Med 2013; 161:0960-45. Electronically Signed   By: Elige Ko   On: 06/19/2016 14:03   US Ob Transvaginal  Result Date: 06/19/2016 CLINICAL DATA:  Pregnant. EXAM: OBSTETRIC <14 WK [place] AND TRANSVAGINAL OB US TECHNIQUE: Both transabdominal and transvaginal ultrasound examinations were performed for complete evaluation of the gestation as well as the maternal uterus, adnexal regions, and pelvic cul-de-sac. Transvaginal technique was performed to assess early pregnancy. COMPARISON:  None. FINDINGS: Intrauterine gestational sac: Single Yolk sac:  Not Visualized. Embryo:  Not Visualized. Cardiac Activity: Not Visualized. MSD: 4  mm   5 w   1  d Subchorionic hemorrhage:  None visualized. Maternal uterus/adnexae: 2.1 x 1.7 x 2.1 cm hypoechoic left ovarian mass likely reflecting corpus luteum cyst. Normal right ovary. Small amount of fluid in the lower uterine segment. Small amount of pelvic free fluid. IMPRESSION: Probable early intrauterine gestational sac, but no yolk sac, fetal pole, or cardiac activity yet visualized. Recommend follow-up quantitative B-HCG levels and follow-up US in 14 days to assess viability. This recommendation follows SRU consensus guidelines: Diagnostic Criteria for Nonviable Pregnancy Early in the First Trimester. Malva Limes Med 2013; 782:9562-13. Electronically Signed   By: Elige Ko   On: 06/19/2016 14:03   Dg Chest Portable 1 View  Result Date: 06/19/2016 CLINICAL DATA:  Recent travel to [place]. Fever and aching. Malaria exposure. EXAM: PORTABLE CHEST 1 VIEW COMPARISON:  01/07/2016 FINDINGS: Heart size is normal. Mediastinal shadows are normal. The lungs are clear. No bronchial thickening. No infiltrate, mass, effusion or collapse. Pulmonary vascularity is normal. No bony  abnormality. IMPRESSION: Normal one-view chest. Electronically Signed   By: Paulina Fusi M.D.   On: 06/19/2016 09:56      Assessment/Plan:  INTERVAL HISTORY:  Pt is sp LP since I saw her this am with the IM team and no evidence of meningitis on LP   Principal Problem:   Acute febrile illness Active Problems:   Pregnancy   Headache    Amber Haas is a 36 y.o. female with hx of prior malaria who while en route from [place] began feeling poorly (she was bitten nightly by multiple mosquitoes and took no malaria prophylaxis) she took COARTEM for 3 days which would have treated P falciparum if taken correctly. She has had any fevers since then. Her HIV test was negative. Syphilis test was negative parasite screen was read negative by the technician and by Dr. Ninetta Lights. She is not anemic nor thrombocytopenic. Her blood cultures have been sterile and her lumbar puncture shows only 1 white cell. Her flu PCR is negative.   #1 Fever and returning traveler from [place]:  Despite her extensive  exposure to malaria I am skeptical that she actually has this given she had a course of coartem. Coartem would not be effective vs hypnozoites of [place] it would be VERY strange for them to emerge a few days after finishing therapy rather than more typically in a month or so after completing her coartem  Her smear is negative. As mentioned her blood cultures are negative CSF was benign. She is growing greater 100,000 colony-forming units of Escherichia coli which does need to be treated despite her lacking symptoms consistent with urinary tract infection, BECAUSE  she is pregnant  I am very skeptical of CH[place] given lack of arthritic complaints. She seems way too well and without sig lab from allergies to have dengue. I suppose yellow fever is possible that seems unlikely. I would send a viral hepatitis panel as well for thoroughness though again she does not have liver function tests elevation  which one would expect with acute hepatitis. I would send  an HIV quantitative RNA and infected has been sent.  I will touch base with Dr. Luciana Axe and or Dr. Drue Second in AM   I spent greater than 35  minutes with the patient including greater than 50% of time in face to face counsel of the patient re her FUO and in coordination of their care.    LOS: 0 days   Acey Lav 06/20/2016, 5:39 PM

## 2016-06-21 DIAGNOSIS — Z789 Other specified health status: Secondary | ICD-10-CM

## 2016-06-21 DIAGNOSIS — R112 Nausea with vomiting, unspecified: Secondary | ICD-10-CM

## 2016-06-21 LAB — BASIC METABOLIC PANEL
Anion gap: 14 (ref 5–15)
Anion gap: 10 (ref 5–15)
CHLORIDE: 95 mmol/L — AB (ref 101–111)
CO2: 25 mmol/L (ref 22–32)
CO2: 25 mmol/L (ref 22–32)
CREATININE: 0.76 mg/dL (ref 0.44–1.00)
Calcium: 8.5 mg/dL — ABNORMAL LOW (ref 8.9–10.3)
Calcium: 8.8 mg/dL — ABNORMAL LOW (ref 8.9–10.3)
Chloride: 98 mmol/L — ABNORMAL LOW (ref 101–111)
Creatinine, Ser: 0.77 mg/dL (ref 0.44–1.00)
GFR calc Af Amer: >60 mL/min (ref >60)
GFR calc Af Amer: >60 mL/min (ref >60)
GFR calc non Af Amer: >60 mL/min (ref >60)
GFR calc non Af Amer: >60 mL/min (ref >60)
GLUCOSE: 102 mg/dL — AB (ref 65–99)
GLUCOSE: 99 mg/dL (ref 65–99)
Potassium: 2.6 mmol/L — CL (ref 3.5–5.1)
Potassium: 3.4 mmol/L — ABNORMAL LOW (ref 3.5–5.1)
SODIUM: 134 mmol/L — AB (ref 135–145)
Sodium: 133 mmol/L — ABNORMAL LOW (ref 135–145)

## 2016-06-21 LAB — CBC
HEMATOCRIT: 34 % — AB (ref 36.0–46.0)
HEMOGLOBIN: 11.7 g/dL — AB (ref 12.0–15.0)
MCH: 29.3 pg (ref 26.0–34.0)
MCHC: 34.4 g/dL (ref 30.0–36.0)
MCV: 85.2 fL (ref 78.0–100.0)
Platelets: 271 10*3/uL (ref 150–400)
RBC: 3.99 MIL/uL (ref 3.87–5.11)
RDW: 13.4 % (ref 11.5–15.5)
WBC: 7.7 10*3/uL (ref 4.0–10.5)

## 2016-06-21 LAB — URINE CULTURE

## 2016-06-21 LAB — HIV-1 RNA QUANT-NO REFLEX-BLD: LOG10 HIV-1 RNA: UNDETERMINED {Log_copies}/mL

## 2016-06-21 LAB — HERPES SIMPLEX VIRUS(HSV) DNA BY PCR
HSV 1 DNA: NEGATIVE
HSV 2 DNA: NEGATIVE

## 2016-06-21 LAB — MAGNESIUM: Magnesium: 2 mg/dL (ref 1.7–2.4)

## 2016-06-21 LAB — GLUCOSE, CAPILLARY: Glucose-Capillary: 110 mg/dL — ABNORMAL HIGH (ref 65–99)

## 2016-06-21 MED ORDER — AMOXICILLIN 500 MG PO CAPS: 500.0000 mg | ORAL_CAPSULE | Freq: Three times a day (TID) | ORAL | 0 refills | Status: DC

## 2016-06-21 MED ORDER — WHITE PETROLATUM GEL
Status: AC
Start: 2016-06-21 — End: 2016-06-21
  Administered 2016-06-21: 13:00:00
  Filled 2016-06-21: qty 1

## 2016-06-21 MED ORDER — POTASSIUM CHLORIDE 20 MEQ/15ML (10%) PO SOLN: 20.0000 meq | Freq: Once | ORAL | Status: DC

## 2016-06-21 MED ORDER — AMOXICILLIN 500 MG PO CAPS
500.0000 mg | ORAL_CAPSULE | Freq: Three times a day (TID) | ORAL | Status: DC
  Administered 2016-06-21: 500 mg via ORAL
  Filled 2016-06-21 (×2): qty 1

## 2016-06-21 MED ORDER — ONDANSETRON 4 MG PO TBDP: 4.0000 mg | ORAL_TABLET | Freq: Three times a day (TID) | ORAL | 0 refills | Status: DC | PRN

## 2016-06-21 MED ORDER — POTASSIUM CHLORIDE CRYS ER 20 MEQ PO TBCR
40.0000 meq | EXTENDED_RELEASE_TABLET | Freq: Two times a day (BID) | ORAL | Status: DC
  Administered 2016-06-21: 40 meq via ORAL
  Filled 2016-06-21: qty 2

## 2016-06-21 MED ORDER — SODIUM CHLORIDE 0.9 % IV SOLN
30.0000 meq | Freq: Once | INTRAVENOUS | Status: AC
  Administered 2016-06-21: 30 meq via INTRAVENOUS
  Filled 2016-06-21: qty 15

## 2016-06-21 MED ORDER — POTASSIUM CHLORIDE 20 MEQ PO PACK
40.0000 meq | PACK | Freq: Once | ORAL | Status: DC
  Filled 2016-06-21: qty 2

## 2016-06-21 MED ORDER — CEPHALEXIN 500 MG PO CAPS: 500.0000 mg | ORAL_CAPSULE | Freq: Two times a day (BID) | ORAL | Status: DC

## 2016-06-21 NOTE — Progress Notes (Signed)
Subjective:   Her fevers have defervesced since yesterday. She feels much better headaches have improved her myalgias are better she has had still trouble with nausea and vomiting and now has some hypokalemia  Antibiotics:  Anti-infectives    Start     Dose/Rate Route Frequency Ordered Stop   06/21/16 1400  amoxicillin (AMOXIL) capsule 500 mg     500 mg Oral Every 8 hours 06/21/16 1153 06/24/16 1359   06/21/16 1200  cephALEXin (KEFLEX) capsule 500 mg  Status:  Discontinued     500 mg Oral Every 12 hours 06/21/16 1146 06/21/16 1151   06/20/16 1600  cefTRIAXone (ROCEPHIN) 1 g in dextrose 5 % 50 mL IVPB  Status:  Discontinued     1 g 100 mL/hr over 30 Minutes Intravenous Every 24 hours 06/19/16 1912 06/21/16 1129   06/20/16 0600  clindamycin (CLEOCIN) capsule 450 mg  Status:  Discontinued     450 mg Oral Every 8 hours 06/19/16 2042 06/21/16 1150   06/19/16 2200  clindamycin (CLEOCIN) IVPB 600 mg     600 mg 100 mL/hr over 30 Minutes Intravenous  Once 06/19/16 2042 06/19/16 2202   06/19/16 1500  cefTRIAXone (ROCEPHIN) 1 g in dextrose 5 % 50 mL IVPB     1 g 100 mL/hr over 30 Minutes Intravenous  Once 06/19/16 1446 06/19/16 1634      Medications: Scheduled Meds: . amoxicillin  500 mg Oral Q8H  . enoxaparin (LOVENOX) injection  40 mg Subcutaneous Q24H  . loratadine  10 mg Oral Daily  . potassium chloride (KCL MULTIRUN) 30 mEq in 265 mL IVPB  30 mEq Intravenous Once  . sodium chloride flush  3 mL Intravenous Q12H   Continuous Infusions: PRN Meds:.acetaminophen **OR** acetaminophen, ondansetron    Objective: Weight change:   Intake/Output Summary (Last 24 hours) at 06/21/16 1325 Last data filed at 06/21/16 0700  Gross per 24 hour  Intake              170 ml  Output                1 ml  Net              169 ml   Blood pressure 115/69, pulse 93, temperature 98.2 F (36.8 C), resp. rate 16, height  (1.575 m), weight 150 lb (68 kg), last menstrual period  05/18/2016, SpO2 100 %. Temp:  [98.2 F (36.8 C)-99.8 F (37.7 C)] 98.2 F (36.8 C) (04/06 0640) Pulse Rate:  [93-112] 93 (04/06 0640) Resp:  [16] 16 (04/06 0640) BP: (99-118)/(50-69) 115/69 (04/06 0640) SpO2:  [98 %-100 %] 100 % (04/06 0640)  Physical Exam: General: Alert and awake, oriented x3, not in any acute distress shivering HEENT: anicteric sclera,  EOMI CVS regular rate, normal r,  no murmur rubs or gallops Chest: clear to auscultation bilaterally, no wheezing, rales or rhonchi Abdomen: soft nontender, nondistended, normal bowel sounds, Extremities: no  clubbing or edema noted bilaterally, no evidence of joint inflammation. Skin: no rashes Neuro: nonfocal  CBC:  CBC Latest Ref Rng & Units 06/21/2016 06/19/2016 04/29/2016  WBC 4.0 - 10.5 K/uL 7.7 7.0 5.4  Hemoglobin 12.0 - 15.0 g/dL 11.7(L) 13.5 13.5  Hematocrit 36.0 - 46.0 % 34.0(L) 38.8 37.8  Platelets 150 - 400 K/uL 271 247 348      BMET  Recent Labs  06/19/16 0933 06/21/16 0536  NA 132* 134*  K 3.3* 2.6*  CL  99* 95*  CO2 22 25  GLUCOSE 102* 102*  BUN <5* <5*  CREATININE 0.83 0.77  CALCIUM 9.0 8.5*     Liver Panel   Recent Labs  06/19/16 0933  PROT 7.0  ALBUMIN 3.1*  AST 32  ALT 46  ALKPHOS 69  BILITOT 0.7       Sedimentation Rate No results for input(s): ESRSEDRATE in the last 72 hours. C-Reactive Protein No results for input(s): CRP in the last 72 hours.  Micro Results: Recent Results (from the past 720 hour(s))  Culture, Urine     Status: Abnormal   Collection Time: 06/19/16 10:15 AM  Result Value Ref Range Status   Specimen Description URINE, RANDOM  Final   Special Requests NONE  Final   Culture >=100,000 COLONIES/mL ESCHERICHIA COLI (A)  Final   Report Status 06/21/2016 FINAL  Final   Organism ID, Bacteria ESCHERICHIA COLI (A)  Final      Susceptibility   Escherichia coli - MIC*    AMPICILLIN <=2 SENSITIVE Sensitive     CEFAZOLIN <=4 SENSITIVE Sensitive     CEFTRIAXONE  <=1 SENSITIVE Sensitive     CIPROFLOXACIN <=0.25 SENSITIVE Sensitive     GENTAMICIN <=1 SENSITIVE Sensitive     IMIPENEM <=0.25 SENSITIVE Sensitive     NITROFURANTOIN <=16 SENSITIVE Sensitive     TRIMETH/SULFA <=20 SENSITIVE Sensitive     AMPICILLIN/SULBACTAM <=2 SENSITIVE Sensitive     PIP/TAZO <=4 SENSITIVE Sensitive     Extended ESBL NEGATIVE Sensitive     * >=100,000 COLONIES/mL ESCHERICHIA COLI  Wet prep, genital     Status: Abnormal   Collection Time: 06/19/16  2:18 PM  Result Value Ref Range Status   Yeast Wet Prep HPF POC NONE SEEN NONE SEEN Final   Trich, Wet Prep NONE SEEN NONE SEEN Final   Clue Cells Wet Prep HPF POC NONE SEEN NONE SEEN Final   WBC, Wet Prep HPF POC MODERATE (A) NONE SEEN Final   Sperm NONE SEEN  Final  Culture, blood (Routine X 2) w Reflex to ID Panel     Status: None (Preliminary result)   Collection Time: 06/20/16 10:24 AM  Result Value Ref Range Status   Specimen Description BLOOD RIGHT ANTECUBITAL  Final   Special Requests IN PEDIATRIC BOTTLE Blood Culture adequate volume  Final   Culture NO GROWTH < 12 HOURS  Final   Report Status PENDING  Incomplete  Culture, blood (Routine X 2) w Reflex to ID Panel     Status: None (Preliminary result)   Collection Time: 06/20/16 10:26 AM  Result Value Ref Range Status   Specimen Description BLOOD RIGHT HAND  Final   Special Requests IN PEDIATRIC BOTTLE Blood Culture adequate volume  Final   Culture NO GROWTH < 12 HOURS  Final   Report Status PENDING  Incomplete  CSF culture with Stat gram stain     Status: None (Preliminary result)   Collection Time: 06/20/16  1:25 PM  Result Value Ref Range Status   Specimen Description BACK  Final   Special Requests NONE  Final   Gram Stain   Final    CYTOSPIN SMEAR WBC PRESENT, PREDOMINANTLY MONONUCLEAR NO ORGANISMS SEEN    Culture NO GROWTH 1 DAY  Final   Report Status PENDING  Incomplete    Studies/Results: US Ob Comp Less 14 Wks  Result Date:  06/19/2016 CLINICAL DATA:  Pregnant. EXAM: OBSTETRIC <14 WK [place] AND TRANSVAGINAL OB US TECHNIQUE: Both transabdominal and transvaginal ultrasound  examinations were performed for complete evaluation of the gestation as well as the maternal uterus, adnexal regions, and pelvic cul-de-sac. Transvaginal technique was performed to assess early pregnancy. COMPARISON:  None. FINDINGS: Intrauterine gestational sac: Single Yolk sac:  Not Visualized. Embryo:  Not Visualized. Cardiac Activity: Not Visualized. MSD: 4  mm   5 w   1  d Subchorionic hemorrhage:  None visualized. Maternal uterus/adnexae: 2.1 x 1.7 x 2.1 cm hypoechoic left ovarian mass likely reflecting corpus luteum cyst. Normal right ovary. Small amount of fluid in the lower uterine segment. Small amount of pelvic free fluid. IMPRESSION: Probable early intrauterine gestational sac, but no yolk sac, fetal pole, or cardiac activity yet visualized. Recommend follow-up quantitative B-HCG levels and follow-up US in 14 days to assess viability. This recommendation follows SRU consensus guidelines: Diagnostic Criteria for Nonviable Pregnancy Early in the First Trimester. Malva Limes Med 2013; 161:0960-45. Electronically Signed   By: Elige Ko   On: 06/19/2016 14:03   US Ob Transvaginal  Result Date: 06/19/2016 CLINICAL DATA:  Pregnant. EXAM: OBSTETRIC <14 WK [place] AND TRANSVAGINAL OB US TECHNIQUE: Both transabdominal and transvaginal ultrasound examinations were performed for complete evaluation of the gestation as well as the maternal uterus, adnexal regions, and pelvic cul-de-sac. Transvaginal technique was performed to assess early pregnancy. COMPARISON:  None. FINDINGS: Intrauterine gestational sac: Single Yolk sac:  Not Visualized. Embryo:  Not Visualized. Cardiac Activity: Not Visualized. MSD: 4  mm   5 w   1  d Subchorionic hemorrhage:  None visualized. Maternal uterus/adnexae: 2.1 x 1.7 x 2.1 cm hypoechoic left ovarian mass likely reflecting corpus luteum cyst.  Normal right ovary. Small amount of fluid in the lower uterine segment. Small amount of pelvic free fluid. IMPRESSION: Probable early intrauterine gestational sac, but no yolk sac, fetal pole, or cardiac activity yet visualized. Recommend follow-up quantitative B-HCG levels and follow-up US in 14 days to assess viability. This recommendation follows SRU consensus guidelines: Diagnostic Criteria for Nonviable Pregnancy Early in the First Trimester. Malva Limes Med 2013; 409:8119-14. Electronically Signed   By: Elige Ko   On: 06/19/2016 14:03      Assessment/Plan:  INTERVAL HISTORY:  pts fevers are now defervesced  Principal Problem:   FUO (fever of unknown origin) Active Problems:   Pregnancy   Headache   Myalgia   Bacteria in urine    Amber Haas is a 36 y.o. female with hx of prior malaria who while en route from [place] began feeling poorly (she was bitten nightly by multiple mosquitoes and took no malaria prophylaxis) she took COARTEM for 3 days which would have treated P falciparum if taken correctly. She has had any fevers since then. Her HIV test was negative. Syphilis test was negative parasite screen was read negative by the technician and by Dr. Ninetta Lights. She is not anemic nor thrombocytopenic. Her blood cultures have been sterile and her lumbar puncture shows only 1 white cell. Her flu PCR is negative.   #1 Fever and returning traveler from [place]:  Despite her extensive  exposure to malaria I am skeptical that she actually has this given she had a course of coartem.  Inverted reviewing what she took she only took 1 tablet of Coartem rather than 4  In the formulation available in the [place]. The timing of doses was correct.   Coartem would not be effective vs hypnozoites of P. [place] it would be VERY strange for them to emerge a few days after finishing therapy  rather than more typically in a month or so after completing her coartem  Her smear is negative x 2 and I  reviewed case with Dr. Drue Second who agrees that this patient DOES NOT have malaria. 4 we can discontinue her antimalarial drugs.   As mentioned her blood cultures are negative CSF was benign. She is growing greater 100,000 colony-forming units of Escherichia coli which does need to be treated despite her lacking symptoms consistent with urinary tract infection, BECAUSE  she is pregnant  I am very skeptical of CHIKUNGUNYA given lack of arthritic complaints. She seems way too well and without sig lab from allergies to have dengue. I suppose yellow fever is possible that seems unlikely. I would send a viral hepatitis panel as well for thoroughness though again she does not have liver function tests elevation which one would expect with acute hepatitis.She has an HIV quant pending  I#2 Bacteruiria in pregnant pt: Would change to amoxicillin to finish out a 5 day course.  She can followup with her PCP and then she should certainly establish herself with Dr. Drue Second or Dr. Luciana Axe prior to future travel to ensure proper vaccines and malaria prophylaxis.  I will sign off please call with further questions.     LOS: 1 day   Acey Lav 06/21/2016, 1:25 PM

## 2016-06-21 NOTE — Progress Notes (Signed)
CRITICAL VALUE ALERT  Critical value received:  K=2.6  Date of notification:  06/21/16  Time of notification:  7:39 AM  Critical value read back:Yes.    Nurse who received alert:  Idelle Jo, RN  MD notified (1st page):  Internal med resident  Time of first page:  0740  MD notified (2nd page):  Time of second page:  Responding MD:   Time MD responded:  ````MD paged to day shift nurse phone````

## 2016-06-21 NOTE — Discharge Summary (Signed)
Name: Amber Haas MRN: [id number] DOB: 08/02/1980 36 y.o. PCP: [facility]  Date of Admission: 06/19/2016  9:24 AM Date of Discharge: 06/21/2016 Attending Physician: Tyson Alias, MD  Discharge Diagnosis: 1. Acute febrile illness in a traveler 2. UTI  3. Pregnancy 4. Hypokalemia  Principal Problem:   Acute febrile illness Active Problems:   Pregnancy   Headache   Myalgia   Bacteria in urine   Recent history of foreign travel   Hypokalemia   UTI (urinary tract infection)   Discharge Medications: Allergies as of 06/21/2016   No Known Allergies     Medication List    TAKE these medications   amoxicillin 500 MG capsule Commonly known as:  AMOXIL Take 1 capsule (500 mg total) by mouth every 8 (eight) hours. Until April 8   ondansetron 4 MG disintegrating tablet Commonly known as:  ZOFRAN-ODT Take 1 tablet (4 mg total) by mouth every 8 (eight) hours as needed for nausea or vomiting.       Disposition and follow-up:   Amber Haas was discharged from [facility] in Stable condition.  At the hospital follow up visit please address:  1. Acute febrile illness in a traveler - assess for any recurrence of fevers, myalgias, headaches, encourage visiting traveler ID clinic prior to travel in future  UTI - assess for urinary symptoms, completion of antibiotic course, check UA/culture to ensure sterilization of urine (necessary for pregnant pts)   Pregnancy - ensure follow up with OB for prenatal care, ensure adequate nutrition, ensure started folic acid supplement   Hypokalemia - assess potassium level   2.  Labs / imaging needed at time of follow-up: BMP, repeat UA and urine culture, repeat fetal ultrasound and hCG in 2 weeks  3.  Pending labs/ test needing follow-up: None  Follow-up Appointments: Follow-up Information    [facility]. Go on 07/05/2016.   Why:  10:30 AM Contact  information: [address] [place] [address] [phone]        [facility]. Go on 06/26/2016.   Why:  one time hospital follow up 1:45 PM Contact information: [address] [place] [address] [phone]          Hospital Course by problem list: Principal Problem:   Acute febrile illness Active Problems:   Pregnancy   Headache   Myalgia   Bacteria in urine   Recent history of foreign travel   Hypokalemia   UTI (urinary tract infection)   1. Acute febrile illness in a traveler Amber Haas is a 36 y.o. woman who presented to the ED on 4/4 with for four days of progressive fevers, sweating, myalgias, and malaise that began during her recent trip to [place]. She began feeling ill at the end of her trip and presumed that she had Malaria which is endemic in that area, and took a course of Artemether/Lumefantrine starting 4/1 and completed it by the time she had returned to the [place]. Her symptoms did not improve and she developed persistent fevers and significant headache and presented to the ED. She was febrile to 103 and tachycardic on presentation and found to be pregnant and have a UTI on initial lab testing. ID was consulted and decision was made to start empiric treatment for malaria with Clindamycin and Quinidine while her parasite blood tests were in process. Repeat parasite smears showed no sign of malaria or other parasites. Denge and  yellow fever serologies were sent out. HIV, RPR, hepatitis, and blood cultures were negative. Given her persistent headache and fevers evaluation for meningitis was necessary on 4/5 and she underwent LP. CSF were normal with no evidence of bacterial or viral meningitis, HSV and crypto Ag were negative. Her symptoms had nearly resolved by 4/6 with no fevers in over 24 hours and her appetite returned. Send out parasite testing results were negative and the empiric Clindamycin/Quinidine were  discontinued. She was ambulatory, eating/drinking well, and feeling her baseline on 4/6 and deemed stable for discharge with close IM clinic follow up.  2. UTI - urinalysis on admission revealed many bacteria, negative nitrite, and TNTC WBCs. She was having minimal symptoms aside from urinary frequency. She was started on IV Ceftriaxone. Urine culture grew >100k cfu E. Coli that was pan-sensitive. She was transitioned to Amoxacillin on 4/6 to finish a 5 day course of antibiotics by 4/8.   3. Pregnancy - on admission urine pregnancy test was positive and serum hCG was found to elevated to 593. Transvaginal US revealed early intrauterine gestational sac without visible fetal pole, yolk sac, and no cardiac activity yet visualized. Patient developed some mild AM nausea and vomiting on 4/5 and was provided with prn Zofran in hospital and also prescription on discharge. Folic acid supplement sent to her pharmacy after discharge. Instructed to follow up with OB/gyn for prenatal care.   4. Hypokalemia - presented with mild hypokalemia to 3.3, attributed to poor nutritional intake in setting of current acute illness, decreased to 2.6 on 4/6. Magnesium was normal at 2.0. Her potassium was repleted via IV and po routes and recovered to 3.4 later that day. Her appetite returned and she was eating full meals on day of discharge.   Discharge Vitals:   BP 115/69 (BP Location: Left Arm)   Pulse 93   Temp 98.2 F (36.8 C)   Resp 16   Ht  (1.575 m)   Wt 150 lb (68 kg) Comment: per patient  LMP 05/18/2016   SpO2 100%   BMI 27.44 kg/m   Pertinent Labs, Studies, and Procedures:  CBC Latest Ref Rng & Units 06/21/2016 06/19/2016 04/29/2016  WBC 4.0 - 10.5 K/uL 7.7 7.0 5.4  Hemoglobin 12.0 - 15.0 g/dL 11.7(L) 13.5 13.5  Hematocrit 36.0 - 46.0 % 34.0(L) 38.8 37.8  Platelets 150 - 400 K/uL 271 247 348   BMP Latest Ref Rng & Units 06/21/2016 06/21/2016 06/19/2016  Glucose 65 - 99 mg/dL 99 409(W) 119(J)  BUN 6 - 20  mg/dL <4(N) <8(G) <9(F)  Creatinine 0.44 - 1.00 mg/dL 6.21 3.08 6.57  Sodium 135 - 145 mmol/L 133(L) 134(L) 132(L)  Potassium 3.5 - 5.1 mmol/L 3.4(L) 2.6(LL) 3.3(L)  Chloride 101 - 111 mmol/L 98(L) 95(L) 99(L)  CO2 22 - 32 mmol/L Calcium 8.9 - 10.3 mg/dL 8.4(O) 9.6(E) 9.0    Ref. Range 06/19/2016 11:24  I-stat hCG, quantitative Latest Ref Range: <5 mIU/mL 593.6 (H)    Ref. Range 06/21/2016 10:42  Magnesium Latest Ref Range: 1.7 - 2.4 mg/dL 2.0    Ref. Range 06/21/2016 10:42  Hep A Ab, IgM Latest Ref Range: Negative  Negative  Hepatitis B Surface Ag Latest Ref Range: Negative  Negative  Hep B Core Ab, IgM Latest Ref Range: Negative  Negative  HCV Ab Latest Ref Range: 0.0 - 0.9 s/co ratio <0.1    Ref. Range 06/21/2016 10:42  HIV 1 RNA Quant Latest Units: copies/mL <20 NONE DETECTED  log10 HIV-1 RNA Latest Units: log10copy/mL UNABLE TO CALCULATE    Ref. Range 06/19/2016 12:40  RPR Latest Ref Range: Non Reactive  Non Reactive    Ref. Range 06/20/2016 13:35  Appearance, CSF Latest Ref Range: CLEAR  CLEAR (A)  Glucose, CSF Latest Ref Range: 40 - 70 mg/dL 62  RBC Count, CSF Latest Ref Range: 0 /cu mm 19 (H)  Other Cells, CSF Unknown TFTC  Color, CSF Latest Ref Range: COLORLESS  COLORLESS  Supernatant Unknown NOT INDICATED  Total  Protein, CSF Latest Ref Range: 15 - 45 mg/dL 25  Tube # Unknown 1  WBC, CSF Latest Ref Range: 0 - 5 /cu mm 1   Urinalysis    Component Value Date/Time   COLORURINE YELLOW 06/19/2016 1017   APPEARANCEUR HAZY (A) 06/19/2016 1017   LABSPEC 1.014 06/19/2016 1017   PHURINE 5.0 06/19/2016 1017   GLUCOSEU NEGATIVE 06/19/2016 1017   HGBUR SMALL (A) 06/19/2016 1017   BILIRUBINUR NEGATIVE 06/19/2016 1017   KETONESUR 5 (A) 06/19/2016 1017   PROTEINUR 100 (A) 06/19/2016 1017   NITRITE NEGATIVE 06/19/2016 1017   LEUKOCYTESUR SMALL (A) 06/19/2016 1017     Crypto Ag and HSV 1/2 DNA negative  Blood and CSF cultures - No growth  Parasite exams 4/4 and 4/5  no visible parasites (p. Falciparum etc)  US Ob Comp Less 14 Wks  Result Date: 06/19/2016 CLINICAL DATA:  Pregnant. EXAM: OBSTETRIC <14 WK [place] AND TRANSVAGINAL OB US TECHNIQUE: Both transabdominal and transvaginal ultrasound examinations were performed for complete evaluation of the gestation as well as the maternal uterus, adnexal regions, and pelvic cul-de-sac. Transvaginal technique was performed to assess early pregnancy. COMPARISON:  None. FINDINGS: Intrauterine gestational sac: Single Yolk sac:  Not Visualized. Embryo:  Not Visualized. Cardiac Activity: Not Visualized. MSD: 4  mm   5 w   1  d Subchorionic hemorrhage:  None visualized. Maternal uterus/adnexae: 2.1 x 1.7 x 2.1 cm hypoechoic left ovarian mass likely reflecting corpus luteum cyst. Normal right ovary. Small amount of fluid in the lower uterine segment. Small amount of pelvic free fluid. IMPRESSION: Probable early intrauterine gestational sac, but no yolk sac, fetal pole, or cardiac activity yet visualized. Recommend follow-up quantitative B-HCG levels and follow-up US in 14 days to assess viability. This recommendation follows SRU consensus guidelines: Diagnostic Criteria for Nonviable Pregnancy Early in the First Trimester. Malva Limes Med 2013; 960:4540-98. Electronically Signed   By: Elige Ko   On: 06/19/2016 14:03   US Ob Transvaginal  Result Date: 06/19/2016 CLINICAL DATA:  Pregnant. EXAM: OBSTETRIC <14 WK [place] AND TRANSVAGINAL OB US TECHNIQUE: Both transabdominal and transvaginal ultrasound examinations were performed for complete evaluation of the gestation as well as the maternal uterus, adnexal regions, and pelvic cul-de-sac. Transvaginal technique was performed to assess early pregnancy. COMPARISON:  None. FINDINGS: Intrauterine gestational sac: Single Yolk sac:  Not Visualized. Embryo:  Not Visualized. Cardiac Activity: Not Visualized. MSD: 4  mm   5 w   1  d Subchorionic hemorrhage:  None visualized. Maternal uterus/adnexae: 2.1 x  1.7 x 2.1 cm hypoechoic left ovarian mass likely reflecting corpus luteum cyst. Normal right ovary. Small amount of fluid in the lower uterine segment. Small amount of pelvic free fluid. IMPRESSION: Probable early intrauterine gestational sac, but no yolk sac, fetal pole, or cardiac activity yet visualized. Recommend follow-up quantitative B-HCG levels and follow-up US in 14 days to assess viability. This recommendation follows SRU consensus guidelines: Diagnostic Criteria for Nonviable Pregnancy Early  in the First Trimester. Malva Limes Med 2013; 161:0960-45. Electronically Signed   By: Elige Ko   On: 06/19/2016 14:03   Dg Chest Portable 1 View  Result Date: 06/19/2016 CLINICAL DATA:  Recent travel to [place]. Fever and aching. Malaria exposure. EXAM: PORTABLE CHEST 1 VIEW COMPARISON:  01/07/2016 FINDINGS: Heart size is normal. Mediastinal shadows are normal. The lungs are clear. No bronchial thickening. No infiltrate, mass, effusion or collapse. Pulmonary vascularity is normal. No bony abnormality. IMPRESSION: Normal one-view chest. Electronically Signed   By: Paulina Fusi M.D.   On: 06/19/2016 09:56    Discharge Instructions: Discharge Instructions    Call MD for:  persistant nausea and vomiting    Complete by:  As directed    Call MD for:  severe uncontrolled pain    Complete by:  As directed    Call MD for:  temperature >100.4    Complete by:  As directed    Diet - low sodium heart healthy    Complete by:  As directed    Increase activity slowly    Complete by:  As directed       Signed: Althia Forts, MD 06/21/2016, 5:19 PM   Pager: [phone]

## 2016-06-21 NOTE — Progress Notes (Signed)
Nsg Discharge Note  Admit Date:  06/19/2016 Discharge date: 06/21/2016   [place] Silos to be D/C'd Home per MD order.  AVS completed.  Copy for chart, and copy for patient signed, and dated. Patient/caregiver able to verbalize understanding.  Discharge Medication: Allergies as of 06/21/2016   No Known Allergies     Medication List    TAKE these medications   amoxicillin 500 MG capsule Commonly known as:  AMOXIL Take 1 capsule (500 mg total) by mouth every 8 (eight) hours. Until April 8   ondansetron 4 MG disintegrating tablet Commonly known as:  ZOFRAN-ODT Take 1 tablet (4 mg total) by mouth every 8 (eight) hours as needed for nausea or vomiting.       Discharge Assessment: Vitals:   06/20/16 2342 06/21/16 0640  BP: 118/68 115/69  Pulse: 99 93  Resp: 16 16  Temp: 99.3 F (37.4 C) 98.2 F (36.8 C)   Skin clean, dry and intact without evidence of skin break down, no evidence of skin tears noted. IV catheter discontinued intact. Site without signs and symptoms of complications - no redness or edema noted at insertion site, patient denies c/o pain - only slight tenderness at site.  Dressing with slight pressure applied.  D/c Instructions-Education: Discharge instructions given to patient/family with verbalized understanding. D/c education completed with patient/family including follow up instructions, medication list, d/c activities limitations if indicated, with other d/c instructions as indicated by MD - patient able to verbalize understanding, all questions fully answered. Patient instructed to return to ED, call 911, or call MD for any changes in condition.  Patient escorted via WC, and D/C home via private auto.  Camillo Flaming, RN 06/21/2016 4:42 PM

## 2016-06-21 NOTE — Progress Notes (Signed)
Subjective: Amber Haas is feeling much better this morning, no headache, fevers, chills, myalgias. She ate all her breakfast and has been ambulating in her room without difficulty.   Objective: Vital signs in last 24 hours: Vitals:   06/20/16 1956 06/20/16 2102 06/20/16 2342 06/21/16 0640  BP: 117/65 (!) 99/50 118/68 115/69  Pulse: (!) 102 (!) 112 99 93  Resp: Temp: 99.3 F (37.4 C) 99.8 F (37.7 C) 99.3 F (37.4 C) 98.2 F (36.8 C)  TempSrc: Oral Oral Oral   SpO2: 99% 100% 98% 100%  Weight:      Height:        Intake/Output Summary (Last 24 hours) at 06/21/16 0719 Last data filed at 06/21/16 0700  Gross per 24 hour  Intake              170 ml  Output                1 ml  Net              169 ml    Physical Exam General appearance: Young woman resting comfortably in bed, no distress HENT: Normocephalic, moist mucous membranes Cardiovascular: RRR, no murmurs, rubs, gallops Respiratory: Clear to auscultation bilaterally, normal work of breathing Abdomen: Soft, non-tender, non-distended, no palpable hepatosplenomegaly Extremities: Normal bulk, no edema, 2+ peripheral pulses Skin: Warm, dry, intact  Labs / Imaging / Procedures: CBC Latest Ref Rng & Units 06/21/2016 06/19/2016 04/29/2016  WBC 4.0 - 10.5 K/uL 7.7 7.0 5.4  Hemoglobin 12.0 - 15.0 g/dL 11.7(L) 13.5 13.5  Hematocrit 36.0 - 46.0 % 34.0(L) 38.8 37.8  Platelets 150 - 400 K/uL 271 247 348   BMP Latest Ref Rng & Units 06/21/2016 06/19/2016 04/29/2016  Glucose 65 - 99 mg/dL 528(U) 132(G) 91  BUN 6 - 20 mg/dL <4(W) <1(U) 7  Creatinine 0.44 - 1.00 mg/dL 2.72 5.36 6.44  Sodium 135 - 145 mmol/L 134(L) 132(L) 138  Potassium 3.5 - 5.1 mmol/L 2.6(LL) 3.3(L) 3.5  Chloride 101 - 111 mmol/L 95(L) 99(L) 105  CO2 22 - 32 mmol/L Calcium 8.9 - 10.3 mg/dL 0.3(K) 9.0 9.2    Assessment/Plan: Amber Haas is a 36 yo woman with PMH HTN, UTI, Malaria admitted for suspected malaria, UTI, and also found to be  pregnant.   Principal Problem:   FUO (fever of unknown origin) Active Problems:   Pregnancy   Headache   Myalgia   Bacteria in urine  Febrile illness, resolved, presented with myalgias, diaphoresis, palpitations, headache, appetite loss. Felt similar to previous bout of malaria, she recently traveled in [place] and did not take prophylaxis, however did complete a 3d course of Coartem in [place] when symptoms began. Concerning for malaria vs other arbovirus such as yellow fever, dengue, West Nile virus. Concerning for meningitis but LP reassuring. Repeat smear have shown known parasite and she has improved to her baseline today. -- Appreciate ID recs -- Repeat smears  reviewed and show no visible parasites -- Discuss with ID - can stop empiric coverage for malaria - dc Clindamycin and Quinidine, no f/up needed -- Zofran-ODT prn for nausea -- Follow sendout for dengue and yellow fever, HIV quant and Hepatitis panel -- Tylenol PRN for pain or fever -- Should see ID before international travel in the future  Hypokalemia, 2.6 this AM, checking Mg, likely due to poor po intake in setting of acute illness -- Received 40 mEq po this AM, will  follow with IV 30 mEq and follow PM BMP  UTI, with urinary frequency, largely asymptomatic, dirty UA on admission, pregnant -- dc Ceftriaxone today -- Transition to oral antibiotic for 3 more day -- Urine culture - pan sensitive E coli  Pregnancy, hCG 593 and gestational sac seen on transvaginal US but [place] fetal pole or yolk sac. Patient is G7P5A1 and was not using any contraception method.  -- Follow up hCG and [place] in 14 days, needs to f/up with OB  Seborrheic dermatitits, moderate crusting yellowish lesions between braids -- Continue to monitor  FEN/GI: Regular diet, replete electrolytes as needed  Dispo: Anticipated discharge today with PCP follow up  LOS: 1 day   Althia Forts, MD 06/21/2016, 7:19 AM Pager: [phone]

## 2016-06-22 LAB — HEPATITIS PANEL, ACUTE
Hep A IgM: NEGATIVE
Hep B C IgM: NEGATIVE
Hepatitis B Surface Ag: NEGATIVE

## 2016-06-22 LAB — PARASITE EXAM, BLOOD

## 2016-06-22 LAB — HIV-1 RNA QUANT-NO REFLEX-BLD: LOG10 HIV-1 RNA: UNDETERMINED {Log_copies}/mL

## 2016-06-23 ENCOUNTER — Other Ambulatory Visit: Payer: Self-pay | Admitting: Internal Medicine

## 2016-06-23 DIAGNOSIS — N390 Urinary tract infection, site not specified: Secondary | ICD-10-CM

## 2016-06-23 DIAGNOSIS — E876 Hypokalemia: Secondary | ICD-10-CM

## 2016-06-23 LAB — CSF CULTURE: CULTURE: NO GROWTH

## 2016-06-23 LAB — CSF CULTURE W GRAM STAIN

## 2016-06-23 MED ORDER — FOLIC ACID 1 MG PO TABS: 1.0000 mg | ORAL_TABLET | Freq: Every day | ORAL | 3 refills | Status: DC

## 2016-06-23 NOTE — Progress Notes (Signed)
Called in Rx for daily folic acid supplement.

## 2016-06-25 ENCOUNTER — Telehealth: Payer: Self-pay | Admitting: Internal Medicine

## 2016-06-25 LAB — MISC LABCORP TEST (SEND OUT): LABCORP TEST CODE: 819213

## 2016-06-25 LAB — CULTURE, BLOOD (ROUTINE X 2)
CULTURE: NO GROWTH
Culture: NO GROWTH
SPECIAL REQUESTS: ADEQUATE
SPECIAL REQUESTS: ADEQUATE

## 2016-06-25 NOTE — Telephone Encounter (Signed)
Calling to confirm appointment for 06/26/16 at 1:45 LMTCB ° °

## 2016-06-26 ENCOUNTER — Ambulatory Visit: Payer: Medicaid Other

## 2016-08-08 ENCOUNTER — Encounter: Payer: Medicaid Other | Admitting: Certified Nurse Midwife

## 2016-08-29 ENCOUNTER — Encounter: Payer: Medicaid Other | Admitting: Certified Nurse Midwife

## 2016-10-11 ENCOUNTER — Encounter: Payer: Medicaid Other | Admitting: Certified Nurse Midwife

## 2016-10-22 ENCOUNTER — Ambulatory Visit (INDEPENDENT_AMBULATORY_CARE_PROVIDER_SITE_OTHER): Payer: Medicaid Other | Admitting: Certified Nurse Midwife

## 2016-10-22 ENCOUNTER — Other Ambulatory Visit (HOSPITAL_COMMUNITY)
Admission: RE | Admit: 2016-10-22 | Discharge: 2016-10-22 | Disposition: A | Discharge status: Home / Self Care | Payer: Medicaid Other | Source: Ambulatory Visit | Attending: Certified Nurse Midwife | Admitting: Certified Nurse Midwife

## 2016-10-22 ENCOUNTER — Encounter: Payer: Self-pay | Admitting: Certified Nurse Midwife

## 2016-10-22 VITALS — BP 120/78 | HR 109 | Wt 172.0 lb

## 2016-10-22 DIAGNOSIS — O0930 Supervision of pregnancy with insufficient antenatal care, unspecified trimester: Secondary | ICD-10-CM

## 2016-10-22 DIAGNOSIS — O10919 Unspecified pre-existing hypertension complicating pregnancy, unspecified trimester: Secondary | ICD-10-CM

## 2016-10-22 DIAGNOSIS — O0990 Supervision of high risk pregnancy, unspecified, unspecified trimester: Secondary | ICD-10-CM | POA: Diagnosis present

## 2016-10-22 DIAGNOSIS — Z3A22 22 weeks gestation of pregnancy: Secondary | ICD-10-CM | POA: Insufficient documentation

## 2016-10-22 DIAGNOSIS — O0932 Supervision of pregnancy with insufficient antenatal care, second trimester: Secondary | ICD-10-CM

## 2016-10-22 DIAGNOSIS — O0992 Supervision of high risk pregnancy, unspecified, second trimester: Secondary | ICD-10-CM

## 2016-10-22 DIAGNOSIS — R509 Fever, unspecified: Secondary | ICD-10-CM

## 2016-10-22 DIAGNOSIS — O10912 Unspecified pre-existing hypertension complicating pregnancy, second trimester: Secondary | ICD-10-CM

## 2016-10-22 MED ORDER — ASPIRIN 81 MG PO CHEW: 81.0000 mg | CHEWABLE_TABLET | Freq: Every day | ORAL | 12 refills | Status: DC

## 2016-10-22 MED ORDER — PRENATE PIXIE 10-0.6-0.4-200 MG PO CAPS: 1.0000 | ORAL_CAPSULE | Freq: Every day | ORAL | 12 refills | Status: DC

## 2016-10-22 NOTE — Progress Notes (Signed)
Patient does have a herniated disc- so she does have pain that travels down her leg at times.

## 2016-10-22 NOTE — Progress Notes (Signed)
Subjective:    Amber Haas is being seen today for her first obstetrical visit.  This is a planned pregnancy. She is at 41w3dgestation. Her obstetrical history is significant for CHTN, Viral illness at start of [place] with [place]: negative work up except for UTI, [place] and [place] does have a hx of previous Malaria exposure\. Relationship with FOB: spouse, living together. Patient does intend to breast feed. Pregnancy history fully reviewed.  The information documented in the HPI was reviewed and verified.  Menstrual History: OB History    Gravida Para Term Preterm AB Living   7 5 5   1 5    SAB TAB Ectopic Multiple Live Births   1     0 5      Obstetric Comments   With last delivery patient states she did have excessive bleeding- not transfusion necessary.       Patient's last menstrual period was 05/18/2016 (approximate).    Past Medical History:  Diagnosis Date  . History of recurrent UTIs   . Hypertension   . Malaria    2016, [place]   Past Surgical History:  Procedure Laterality Date  . axillary disection Left    years ago     (Not in a hospital admission) No Known Allergies  Social History  Substance Use Topics  . Smoking status: Never Smoker  . Smokeless tobacco: Never Used  . Alcohol use No     Comment: occasional    Family History  Problem Relation Age of Onset  . Hypertension Father   . Diabetes Maternal Grandmother   . Heart disease Paternal Grandmother      Review of Systems Constitutional: negative for weight loss Gastrointestinal: negative for vomiting Genitourinary:negative for genital lesions and vaginal discharge and dysuria Musculoskeletal:negative for back pain Behavioral/Psych: negative for abusive relationship, depression, illegal drug usage and tobacco use    Objective:    BP 120/78   Pulse (!) 109   Wt 172 lb (78 kg)   LMP  05/18/2016 (Approximate)   BMI 31.46 kg/m  General Appearance:    Alert, cooperative, no distress, appears stated age  Head:    Normocephalic, without obvious abnormality, atraumatic  Eyes:    PERRL, conjunctiva/corneas clear, EOM's intact, fundi    benign, both eyes  Ears:    Normal TM's and external ear canals, both ears  Nose:   Nares normal, septum midline, mucosa normal, no drainage    or sinus tenderness  Throat:   Lips, mucosa, and tongue normal; teeth and gums normal  Neck:   Supple, symmetrical, trachea midline, no adenopathy;    thyroid:  no enlargement/tenderness/nodules; no carotid   bruit or JVD  Back:     Symmetric, no curvature, ROM normal, no CVA tenderness  Lungs:     Clear to auscultation bilaterally, respirations unlabored  Chest Wall:    No tenderness or deformity   Heart:    Regular rate and rhythm, S1 and S2 normal, no murmur, rub   or gallop  Breast Exam:    No tenderness, masses, or nipple abnormality  Abdomen:     Soft, non-tender, bowel sounds active all four quadrants,    no masses, no organomegaly  Genitalia:    Normal female without lesion, discharge or tenderness  Extremities:   Extremities normal, atraumatic, no cyanosis or edema  Pulses:   2+ and symmetric all extremities  Skin:  Skin color, texture, turgor normal, no rashes or lesions  Lymph nodes:   Cervical, supraclavicular, and axillary nodes normal  Neurologic:   CNII-XII intact, normal strength, sensation and reflexes    throughout          Cervix:   Long, thick, closed and posterior.  FHR: 145 by doppler.  FH: 22 cm    Lab Review Urine pregnancy test Labs reviewed yes Radiologic studies reviewed yes  Assessment & Plan    Pregnancy at 74w3dweeks    1. Supervision of high risk pregnancy, antepartum     D/T CHTN - Cytology - PAP - Cervicovaginal ancillary only - VITAMIN D 25 Hydroxy (Vit-D Deficiency, Fractures) - Varicella zoster antibody, IgG - Culture, OB Urine - MaterniT21 PLUS  Core+SCA - Obstetric Panel, Including HIV - Cystic Fibrosis Mutation 97 - Hemoglobin A1c - Prenat-FeAsp-Meth-FA-DHA w/o A (PRENATE PIXIE) 10-0.6-0.4-200 MG CAPS; Take 1 tablet by mouth daily.  Dispense: 30 capsule; Refill: 12 - [place] OB DETAIL +14 WK; Future  2. Acute febrile illness     Early in pregnancy around 5 weeks by [place]- [place] OB DETAIL +14 WK; Future  3. Chronic hypertension during pregnancy, antepartum      Normotensive today.  - Comp Met (CMET) - aspirin 81 MG chewable tablet; Chew 1 tablet (81 mg total) by mouth daily.  Dispense: 30 tablet; Refill: 12 - Creatinine clearance, urine, 24 hour; Future - Protein, urine, 24 hour; Future - [place] OB DETAIL +14 WK; Future  4. Late prenatal care     @22  weeks - [place] OB DET[place] +14 WK; Future     Prenatal vitamins.  Counseling provided regarding continued use of seat belts, cessation of alcohol consumption, smoking or use of illicit drugs; infection precautions i.e., influenza/TDAP immunizations, toxoplasmosis,CMV, parvovirus, listeria and varicella; workplace safety, exercise during pregnancy; routine dental care, safe medications, sexual activity, hot tubs, saunas, pools, travel, caffeine use, fish and methlymercury, potential toxins, hair treatments, varicose veins Weight gain recommendations per IOM guidelines reviewed: underweight/BMI< 18.5--> gain 28 - 40 lbs; normal weight/BMI 18.5 - 24.9--> gain 25 - 35 lbs; overweight/BMI 25 - 29.9--> gain 15 - 25 lbs; obese/BMI >30->gain  11 - 20 lbs Problem list reviewed and updated. FIRST/CF mutation testing/NIPT/QUAD SCREEN/fragile X/Ashkenazi Jewish population testing/Spinal muscular atrophy discussed: ordered. Role of ultrasound in pregnancy discussed; fetal survey: ordered. Amniocentesis discussed: not indicated.  Meds ordered this encounter  Medications  . Prenatal Vit-Fe Fumarate-FA (MULTIVITAMIN-PRENATAL) 27-0.8 MG TABS tablet    Sig: Take 1 tablet by mouth daily at 12  noon.  .Marland Kitchenaspirin 81 MG chewable tablet    Sig: Chew 1 tablet (81 mg total) by mouth daily.    Dispense:  30 tablet    Refill:  12  . Prenat-FeAsp-Meth-FA-DHA w/o A (PRENATE PIXIE) 10-0.6-0.4-200 MG CAPS    Sig: Take 1 tablet by mouth daily.    Dispense:  30 capsule    Refill:  12    Please process coupon: Rx BIN: [id number] RxPCN: OHCP, RxGRP:: [id number] RxID:: [id number] SUF: 01   Orders Placed This Encounter  Procedures  . Culture, OB Urine  . [place] OB DETAIL +14 WK    Standing Status:   Future    Standing Expiration Date:   12/22/2017    Order Specific Question:   Reason for Exam (SYMPTOM  OR DIAGNOSIS REQUIRED)    Answer:   fetal anatomy scan    Order Specific Question:   Preferred  imaging location?    Answer:   [facility]-Ultrasound  . VITAMIN D 25 Hydroxy (Vit-D Deficiency, Fractures)  . Varicella zoster antibody, IgG  . MaterniT21 PLUS Core+SCA    Order Specific Question:   Is the patient insulin dependent?    Answer:   No    Order Specific Question:   Please enter gestational age. This should be expressed as weeks AND days, i.e. 16w 6d. Enter weeks here. Enter days in next question.    Answer:   58    Order Specific Question:   Please enter gestational age. This should be expressed as weeks AND days, i.e. 16w 6d. Enter days here. Enter weeks in previous question.    Answer:   3    Order Specific Question:   How was gestational age calculated?    Answer:   LMP    Order Specific Question:   Please give the date of LMP OR Ultrasound OR Estimated date of delivery.    Answer:   02/22/2017    Order Specific Question:   Number of Fetuses (Type of Pregnancy):    Answer:   1    Order Specific Question:   Indications for performing the test? (please choose all that apply):    Answer:   Routine screening    Order Specific Question:   Other Indications? (Y=Yes, N=No)    Answer:   Y    Order Specific Question:   Please specify other indications, if any:    Answer:   CHTN, viral illness  early in pregnancy    Order Specific Question:   If this is a repeat specimen, please indicate the reason:    Answer:   Not indicated    Order Specific Question:   Please specify the patient's race: (C=White/Caucasion, B=Black, I=Native American, A=Asian, H=Hispanic, O=Other, U=Unknown)    Answer:   O    Order Specific Question:   Donor Egg - indicate if the egg was obtained from in vitro fertilization.    Answer:   N    Order Specific Question:   Age of Egg Donor.    Answer:   54    Order Specific Question:   Prior Down Syndrome/ONTD screening during current pregnancy.    Answer:   N    Order Specific Question:   Prior First Trimester Testing    Answer:   N    Order Specific Question:   Prior Second Trimester Testing    Answer:   N    Order Specific Question:   Family History of Neural Tube Defects    Answer:   N    Order Specific Question:   Prior Pregnancy with Down Syndrome    Answer:   N    Order Specific Question:   Please give the patient's weight (in pounds)    Answer:   172  . Obstetric Panel, Including HIV  . Cystic Fibrosis Mutation 97  . Hemoglobin A1c  . Comp Met (CMET)  . Creatinine clearance, urine, 24 hour    Standing Status:   Future    Standing Expiration Date:   10/22/2017  . Protein, urine, 24 hour    Standing Status:   Future    Standing Expiration Date:   10/22/2017    Follow up in 2 weeks. 50% of 30 min visit spent on counseling and coordination of care.

## 2016-10-23 LAB — COMPREHENSIVE METABOLIC PANEL
ALBUMIN: 3.5 g/dL (ref 3.5–5.5)
ALT: 12 IU/L (ref 0–32)
AST: 14 IU/L (ref 0–40)
Albumin/Globulin Ratio: 1.3 (ref 1.2–2.2)
Alkaline Phosphatase: 42 IU/L (ref 39–117)
BUN / CREAT RATIO: 8 — AB (ref 9–23)
BUN: 5 mg/dL — AB (ref 6–20)
Bilirubin Total: <0.2 mg/dL (ref 0.0–1.2)
CO2: 18 mmol/L — AB (ref 20–29)
CREATININE: 0.63 mg/dL (ref 0.57–1.00)
Calcium: 9.5 mg/dL (ref 8.7–10.2)
Chloride: 103 mmol/L (ref 96–106)
GFR calc non Af Amer: 117 mL/min/{1.73_m2} (ref >59)
GFR, EST AFRICAN AMERICAN: 135 mL/min/{1.73_m2} (ref >59)
GLOBULIN, TOTAL: 2.7 g/dL (ref 1.5–4.5)
GLUCOSE: 96 mg/dL (ref 65–99)
Potassium: 3.7 mmol/L (ref 3.5–5.2)
SODIUM: 137 mmol/L (ref 134–144)
TOTAL PROTEIN: 6.2 g/dL (ref 6.0–8.5)

## 2016-10-23 LAB — OBSTETRIC PANEL, INCLUDING HIV
ANTIBODY SCREEN: NEGATIVE
BASOS: 0 %
Basophils Absolute: 0 10*3/uL (ref 0.0–0.2)
EOS (ABSOLUTE): 0.4 10*3/uL (ref 0.0–0.4)
EOS: 5 %
HEMATOCRIT: 34.9 % (ref 34.0–46.6)
HEMOGLOBIN: 11.6 g/dL (ref 11.1–15.9)
HEP B S AG: NEGATIVE
HIV Screen 4th Generation wRfx: NONREACTIVE
IMMATURE GRANULOCYTES: 1 %
Immature Grans (Abs): 0 10*3/uL (ref 0.0–0.1)
Lymphocytes Absolute: 1.8 10*3/uL (ref 0.7–3.1)
Lymphs: 25 %
MCH: 30.1 pg (ref 26.6–33.0)
MCHC: 33.2 g/dL (ref 31.5–35.7)
MCV: 90 fL (ref 79–97)
MONOS ABS: 0.5 10*3/uL (ref 0.1–0.9)
Monocytes: 7 %
NEUTROS PCT: 62 %
Neutrophils Absolute: 4.5 10*3/uL (ref 1.4–7.0)
Platelets: 273 10*3/uL (ref 150–379)
RBC: 3.86 x10E6/uL (ref 3.77–5.28)
RDW: 14 % (ref 12.3–15.4)
RH TYPE: POSITIVE
RPR: NONREACTIVE
Rubella Antibodies, IGG: <0.9 index — ABNORMAL LOW (ref >0.99)
WBC: 7.1 10*3/uL (ref 3.4–10.8)

## 2016-10-23 LAB — CERVICOVAGINAL ANCILLARY ONLY
BACTERIAL VAGINITIS: NEGATIVE
CANDIDA VAGINITIS: NEGATIVE
Chlamydia: NEGATIVE
Neisseria Gonorrhea: NEGATIVE
Trichomonas: NEGATIVE

## 2016-10-23 LAB — VARICELLA ZOSTER ANTIBODY, IGG: Varicella zoster IgG: 441 index (ref >165)

## 2016-10-23 LAB — HEMOGLOBIN A1C
Est. average glucose Bld gHb Est-mCnc: 100 mg/dL
Hgb A1c MFr Bld: 5.1 % (ref 4.8–5.6)

## 2016-10-23 LAB — VITAMIN D 25 HYDROXY (VIT D DEFICIENCY, FRACTURES): Vit D, 25-Hydroxy: 41.5 ng/mL (ref 30.0–100.0)

## 2016-10-24 ENCOUNTER — Telehealth: Payer: Self-pay | Admitting: *Deleted

## 2016-10-24 LAB — URINE CULTURE, OB REFLEX

## 2016-10-24 LAB — CULTURE, OB URINE

## 2016-10-24 NOTE — Telephone Encounter (Signed)
Pt called to office asking about 24 hour urine test, why it was needed. Explained to pt it is being done due to her hx of CHTN. Pt states understanding.

## 2016-10-28 LAB — MATERNIT21 PLUS CORE+SCA
CHROMOSOME 13: NEGATIVE
CHROMOSOME 21: NEGATIVE
Chromosome 18: NEGATIVE
Y Chromosome: DETECTED

## 2016-10-28 LAB — CYTOLOGY - PAP
Diagnosis: NEGATIVE
HPV: NOT DETECTED

## 2016-10-29 ENCOUNTER — Other Ambulatory Visit: Payer: Self-pay | Admitting: Certified Nurse Midwife

## 2016-10-29 DIAGNOSIS — O9989 Other specified diseases and conditions complicating pregnancy, childbirth and the puerperium: Secondary | ICD-10-CM

## 2016-10-29 DIAGNOSIS — Z283 Underimmunization status: Secondary | ICD-10-CM

## 2016-10-29 DIAGNOSIS — O09899 Supervision of other high risk pregnancies, unspecified trimester: Secondary | ICD-10-CM

## 2016-10-29 DIAGNOSIS — Z2839 Other underimmunization status: Secondary | ICD-10-CM | POA: Insufficient documentation

## 2016-10-29 DIAGNOSIS — O0990 Supervision of high risk pregnancy, unspecified, unspecified trimester: Secondary | ICD-10-CM

## 2016-10-29 LAB — CYSTIC FIBROSIS MUTATION 97: GENE DIS ANAL CARRIER INTERP BLD/T-IMP: NOT DETECTED

## 2016-10-31 ENCOUNTER — Inpatient Hospital Stay (HOSPITAL_COMMUNITY): Admission: RE | Admit: 2016-10-31 | Payer: Medicaid Other | Source: Ambulatory Visit

## 2016-11-05 ENCOUNTER — Encounter: Payer: Medicaid Other | Admitting: Certified Nurse Midwife

## 2016-11-06 ENCOUNTER — Ambulatory Visit (INDEPENDENT_AMBULATORY_CARE_PROVIDER_SITE_OTHER): Payer: Medicaid Other | Admitting: Certified Nurse Midwife

## 2016-11-06 VITALS — BP 123/74 | HR 120 | Wt 171.1 lb

## 2016-11-06 DIAGNOSIS — O0990 Supervision of high risk pregnancy, unspecified, unspecified trimester: Secondary | ICD-10-CM

## 2016-11-06 DIAGNOSIS — O0930 Supervision of pregnancy with insufficient antenatal care, unspecified trimester: Secondary | ICD-10-CM

## 2016-11-06 DIAGNOSIS — O0932 Supervision of pregnancy with insufficient antenatal care, second trimester: Secondary | ICD-10-CM

## 2016-11-06 DIAGNOSIS — O10912 Unspecified pre-existing hypertension complicating pregnancy, second trimester: Secondary | ICD-10-CM

## 2016-11-06 DIAGNOSIS — O0992 Supervision of high risk pregnancy, unspecified, second trimester: Secondary | ICD-10-CM

## 2016-11-06 DIAGNOSIS — Z789 Other specified health status: Secondary | ICD-10-CM

## 2016-11-06 DIAGNOSIS — O10919 Unspecified pre-existing hypertension complicating pregnancy, unspecified trimester: Secondary | ICD-10-CM

## 2016-11-06 NOTE — Addendum Note (Signed)
Addended by: Burnell Blanks on: 11/06/2016 03:48 PM   Modules accepted: Orders

## 2016-11-06 NOTE — Progress Notes (Signed)
   PRENATAL VISIT NOTE  Subjective:  Amber Haas is a 35 y.o. [id number] at [redacted]w[redacted]d being seen today for ongoing prenatal care.  She is currently monitored for the following issues for this high-risk pregnancy and has Chronic hypertension during pregnancy, antepartum; Acute febrile illness; Supervision of high risk pregnancy, antepartum; Recent history of foreign travel; UTI (urinary tract infection); Late prenatal care; and Rubella non-immune status, antepartum on her problem list.  Patient reports no complaints.  Contractions: Not present. Vag. Bleeding: None.  Movement: Present. Denies leaking of fluid.   The following portions of the patient's history were reviewed and updated as appropriate: allergies, current medications, past family history, past medical history, past social history, past surgical history and problem list. Problem list updated.  Objective:   Vitals:   11/06/16 1543  BP: 123/74  Pulse: (!) 120  Weight: 171 lb 1.6 oz (77.6 kg)    Fetal Status: Fetal Heart Rate (bpm): 145; doppler Fundal Height: 25 cm Movement: Present     General:  Alert, oriented and cooperative. Patient is in no acute distress.  Skin: Skin is warm and dry. No rash noted.   Cardiovascular: Normal heart rate noted  Respiratory: Normal respiratory effort, no problems with respiration noted  Abdomen: Soft, gravid, appropriate for gestational age.  Pain/Pressure: Absent     Pelvic: Cervical exam deferred        Extremities: Normal range of motion.  Edema: None  Mental Status:  Normal mood and affect. Normal behavior. Normal judgment and thought content.   Assessment and Plan:  Pregnancy: [id number] at [redacted]w[redacted]d  1. Supervision of high risk pregnancy, antepartum      Desires BTL  2. Late prenatal care     Was in [place].    3. Recent history of foreign travel     With infection and hospital admission upon return to [place].   4. Chronic hypertension during pregnancy, antepartum     Stable,  normotensive today.  Not on blood pressure medications.  Is taking baby ASA.  Antenatal testing at 32 weeks discussed.  Has growth [place] scheduled for tomorrow 11/07/16.    Preterm labor symptoms and general obstetric precautions including but not limited to vaginal bleeding, contractions, leaking of fluid and fetal movement were reviewed in detail with the patient. Please refer to After Visit Summary for other counseling recommendations.  Return in about 4 weeks (around 12/04/2016) for [facility], 2 hr OGTT.   Roe Coombs, CNM

## 2016-11-07 ENCOUNTER — Other Ambulatory Visit: Payer: Self-pay | Admitting: Certified Nurse Midwife

## 2016-11-07 ENCOUNTER — Encounter (HOSPITAL_COMMUNITY): Payer: Self-pay

## 2016-11-07 ENCOUNTER — Ambulatory Visit (HOSPITAL_COMMUNITY)
Admission: RE | Admit: 2016-11-07 | Discharge: 2016-11-07 | Disposition: A | Discharge status: Home / Self Care | Payer: Medicaid Other | Source: Ambulatory Visit | Attending: Certified Nurse Midwife | Admitting: Certified Nurse Midwife

## 2016-11-07 DIAGNOSIS — O0990 Supervision of high risk pregnancy, unspecified, unspecified trimester: Secondary | ICD-10-CM

## 2016-11-07 DIAGNOSIS — Z3A24 24 weeks gestation of pregnancy: Secondary | ICD-10-CM | POA: Diagnosis not present

## 2016-11-07 DIAGNOSIS — O0930 Supervision of pregnancy with insufficient antenatal care, unspecified trimester: Secondary | ICD-10-CM | POA: Diagnosis not present

## 2016-11-07 DIAGNOSIS — R509 Fever, unspecified: Secondary | ICD-10-CM

## 2016-11-07 DIAGNOSIS — O10919 Unspecified pre-existing hypertension complicating pregnancy, unspecified trimester: Secondary | ICD-10-CM | POA: Diagnosis present

## 2016-11-07 LAB — CREATININE CLEARANCE, URINE, 24 HOUR
CREAT CLEAR: 86 mL/min — AB (ref 88–128)
CREATININE 24H UR: 696 mg/(24.h) — AB (ref 800–1800)
CREATININE: 0.56 mg/dL — AB (ref 0.57–1.00)
Creatinine, Urine: 46.4 mg/dL
GFR calc Af Amer: 141 mL/min/{1.73_m2} (ref >59)
GFR, EST NON AFRICAN AMERICAN: 122 mL/min/{1.73_m2} (ref >59)

## 2016-11-07 LAB — PROTEIN, URINE, 24 HOUR
PROTEIN 24H UR: 291 mg/(24.h) — AB (ref 30–150)
Protein, Ur: 19.4 mg/dL

## 2016-11-14 ENCOUNTER — Ambulatory Visit (HOSPITAL_COMMUNITY): Payer: Medicaid Other

## 2016-12-04 ENCOUNTER — Other Ambulatory Visit: Payer: Medicaid Other

## 2016-12-04 ENCOUNTER — Encounter: Payer: Medicaid Other | Admitting: Obstetrics & Gynecology

## 2016-12-12 ENCOUNTER — Ambulatory Visit (INDEPENDENT_AMBULATORY_CARE_PROVIDER_SITE_OTHER): Payer: Medicaid Other | Admitting: Obstetrics & Gynecology

## 2016-12-12 ENCOUNTER — Other Ambulatory Visit: Payer: Medicaid Other

## 2016-12-12 DIAGNOSIS — O0990 Supervision of high risk pregnancy, unspecified, unspecified trimester: Secondary | ICD-10-CM

## 2016-12-12 DIAGNOSIS — Z3493 Encounter for supervision of normal pregnancy, unspecified, third trimester: Secondary | ICD-10-CM

## 2016-12-12 DIAGNOSIS — O10913 Unspecified pre-existing hypertension complicating pregnancy, third trimester: Secondary | ICD-10-CM

## 2016-12-12 DIAGNOSIS — O10919 Unspecified pre-existing hypertension complicating pregnancy, unspecified trimester: Secondary | ICD-10-CM

## 2016-12-12 DIAGNOSIS — O0993 Supervision of high risk pregnancy, unspecified, third trimester: Secondary | ICD-10-CM

## 2016-12-12 NOTE — Progress Notes (Signed)
Pt c/o persistent cough x 3 wks. Has tried natural remedies, tea, and honey. Denies fever.

## 2016-12-12 NOTE — Progress Notes (Signed)
   PRENATAL VISIT NOTE  Subjective:  Amber Haas is a 36 y.o. [id number] at [redacted]w[redacted]d being seen today for ongoing prenatal care.  She is currently monitored for the following issues for this high-risk pregnancy and has Chronic hypertension during pregnancy, antepartum; Acute febrile illness; Supervision of high risk pregnancy, antepartum; Recent history of foreign travel; UTI (urinary tract infection); Late prenatal care; and Rubella non-immune status, antepartum on her problem list.  Patient reports cough.  Contractions: Not present. Vag. Bleeding: None.  Movement: Present. Denies leaking of fluid.   The following portions of the patient's history were reviewed and updated as appropriate: allergies, current medications, past family history, past medical history, past social history, past surgical history and problem list. Problem list updated.  Objective:   Vitals:   12/12/16 1037  BP: 120/79  Pulse: (!) 104  Weight: 178 lb 8 oz (81 kg)    Fetal Status:     Movement: Present     General:  Alert, oriented and cooperative. Patient is in no acute distress.  Skin: Skin is warm and dry. No rash noted.   Cardiovascular: Normal heart rate noted  Respiratory: Normal respiratory effort, no problems with respiration noted,CTA  Abdomen: Soft, gravid, appropriate for gestational age.  Pain/Pressure: Present     Pelvic: Cervical exam deferred        Extremities: Normal range of motion.  Edema: None  Mental Status:  Normal mood and affect. Normal behavior. Normal judgment and thought content.   Assessment and Plan:  Pregnancy: [id number] at [redacted]w[redacted]d  1. Supervision of high risk pregnancy, antepartum  - [place] MFM OB FOLLOW UP; Future  2. Chronic hypertension during pregnancy, antepartum  - [place] MFM OB FOLLOW UP; Future  Preterm labor symptoms and general obstetric precautions including but not limited to vaginal bleeding, contractions, leaking of fluid and fetal movement were reviewed in detail  with the patient. Please refer to After Visit Summary for other counseling recommendations.  Return in about 2 weeks (around 12/26/2016). DM cough medication and cough drops OK for cough   Scheryl Darter, MD

## 2016-12-12 NOTE — Patient Instructions (Signed)
Third Trimester of Pregnancy The third trimester is from week 28 through week 40 (months 7 through 9). The third trimester is a time when the unborn baby (fetus) is growing rapidly. At the end of the ninth month, the fetus is about 20 inches in length and weighs 6-10 pounds. Body changes during your third trimester Your body will continue to go through many changes during pregnancy. The changes vary from woman to woman. During the third trimester:  Your weight will continue to increase. You can expect to gain 25-35 pounds (11-16 kg) by the end of the pregnancy.  You may begin to get stretch marks on your hips, abdomen, and breasts.  You may urinate more often because the fetus is moving lower into your pelvis and pressing on your bladder.  You may develop or continue to have heartburn. This is caused by increased hormones that slow down muscles in the digestive tract.  You may develop or continue to have constipation because increased hormones slow digestion and cause the muscles that push waste through your intestines to relax.  You may develop hemorrhoids. These are swollen veins (varicose veins) in the rectum that can itch or be painful.  You may develop swollen, bulging veins (varicose veins) in your legs.  You may have increased body aches in the pelvis, back, or thighs. This is due to weight gain and increased hormones that are relaxing your joints.  You may have changes in your hair. These can include thickening of your hair, rapid growth, and changes in texture. Some women also have hair loss during or after pregnancy, or hair that feels dry or thin. Your hair will most likely return to normal after your baby is born.  Your breasts will continue to grow and they will continue to become tender. A yellow fluid (colostrum) may leak from your breasts. This is the first milk you are producing for your baby.  Your belly button may stick out.  You may notice more swelling in your hands,  face, or ankles.  You may have increased tingling or numbness in your hands, arms, and legs. The skin on your belly may also feel numb.  You may feel short of breath because of your expanding uterus.  You may have more problems sleeping. This can be caused by the size of your belly, increased need to urinate, and an increase in your body's metabolism.  You may notice the fetus "dropping," or moving lower in your abdomen (lightening).  You may have increased vaginal discharge.  You may notice your joints feel loose and you may have pain around your pelvic bone.  What to expect at prenatal visits You will have prenatal exams every 2 weeks until week 36. Then you will have weekly prenatal exams. During a routine prenatal visit:  You will be weighed to make sure you and the baby are growing normally.  Your blood pressure will be taken.  Your abdomen will be measured to track your baby's growth.  The fetal heartbeat will be listened to.  Any test results from the previous visit will be discussed.  You may have a cervical check near your due date to see if your cervix has softened or thinned (effaced).  You will be tested for Group B streptococcus. This happens between 35 and 37 weeks.  Your health care provider may ask you:  What your birth plan is.  How you are feeling.  If you are feeling the baby move.  If you have had   any abnormal symptoms, such as leaking fluid, bleeding, severe headaches, or abdominal cramping.  If you are using any tobacco products, including cigarettes, chewing tobacco, and electronic cigarettes.  If you have any questions.  Other tests or screenings that may be performed during your third trimester include:  Blood tests that check for low iron levels (anemia).  Fetal testing to check the health, activity level, and growth of the fetus. Testing is done if you have certain medical conditions or if there are problems during the  pregnancy.  Nonstress test (NST). This test checks the health of your baby to make sure there are no signs of problems, such as the baby not getting enough oxygen. During this test, a belt is placed around your belly. The baby is made to move, and its heart rate is monitored during movement.  What is false labor? False labor is a condition in which you feel small, irregular tightenings of the muscles in the womb (contractions) that usually go away with rest, changing position, or drinking water. These are called Braxton Hicks contractions. Contractions may last for hours, days, or even weeks before true labor sets in. If contractions come at regular intervals, become more frequent, increase in intensity, or become painful, you should see your health care provider. What are the signs of labor?  Abdominal cramps.  Regular contractions that start at 10 minutes apart and become stronger and more frequent with time.  Contractions that start on the top of the uterus and spread down to the lower abdomen and back.  Increased pelvic pressure and dull back pain.  A watery or bloody mucus discharge that comes from the vagina.  Leaking of amniotic fluid. This is also known as your "water breaking." It could be a slow trickle or a gush. Let your health care provider know if it has a color or strange odor. If you have any of these signs, call your health care provider right away, even if it is before your due date. Follow these instructions at home: Medicines  Follow your health care provider's instructions regarding medicine use. Specific medicines may be either safe or unsafe to take during pregnancy.  Take a prenatal vitamin that contains at least 600 micrograms (mcg) of folic acid.  If you develop constipation, try taking a stool softener if your health care provider approves. Eating and drinking  Eat a balanced diet that includes fresh fruits and vegetables, whole grains, good sources of protein  such as meat, eggs, or tofu, and low-fat dairy. Your health care provider will help you determine the amount of weight gain that is right for you.  Avoid raw meat and uncooked cheese. These carry germs that can cause birth defects in the baby.  If you have low calcium intake from food, talk to your health care provider about whether you should take a daily calcium supplement.  Eat four or five small meals rather than three large meals a day.  Limit foods that are high in fat and processed sugars, such as fried and sweet foods.  To prevent constipation: ? Drink enough fluid to keep your urine clear or pale yellow. ? Eat foods that are high in fiber, such as fresh fruits and vegetables, whole grains, and beans. Activity  Exercise only as directed by your health care provider. Most women can continue their usual exercise routine during pregnancy. Try to exercise for 30 minutes at least 5 days a week. Stop exercising if you experience uterine contractions.  Avoid heavy   lifting.  Do not exercise in extreme heat or humidity, or at high altitudes.  Wear low-heel, comfortable shoes.  Practice good posture.  You may continue to have sex unless your health care provider tells you otherwise. Relieving pain and discomfort  Take frequent breaks and rest with your legs elevated if you have leg cramps or low back pain.  Take warm sitz baths to soothe any pain or discomfort caused by hemorrhoids. Use hemorrhoid cream if your health care provider approves.  Wear a good support bra to prevent discomfort from breast tenderness.  If you develop varicose veins: ? Wear support pantyhose or compression stockings as told by your healthcare provider. ? Elevate your feet for 15 minutes, 3-4 times a day. Prenatal care  Write down your questions. Take them to your prenatal visits.  Keep all your prenatal visits as told by your health care provider. This is important. Safety  Wear your seat belt at  all times when driving.  Make a list of emergency phone numbers, including numbers for family, friends, the hospital, and police and fire departments. General instructions  Avoid cat litter boxes and soil used by cats. These carry germs that can cause birth defects in the baby. If you have a cat, ask someone to clean the litter box for you.  Do not travel far distances unless it is absolutely necessary and only with the approval of your health care provider.  Do not use hot tubs, steam rooms, or saunas.  Do not drink alcohol.  Do not use any products that contain nicotine or tobacco, such as cigarettes and e-cigarettes. If you need help quitting, ask your health care provider.  Do not use any medicinal herbs or unprescribed drugs. These chemicals affect the formation and growth of the baby.  Do not douche or use tampons or scented sanitary pads.  Do not cross your legs for long periods of time.  To prepare for the arrival of your baby: ? Take prenatal classes to understand, practice, and ask questions about labor and delivery. ? Make a trial run to the hospital. ? Visit the hospital and tour the maternity area. ? Arrange for maternity or paternity leave through employers. ? Arrange for family and friends to take care of pets while you are in the hospital. ? Purchase a rear-facing car seat and make sure you know how to install it in your car. ? Pack your hospital bag. ? Prepare the baby's nursery. Make sure to remove all pillows and stuffed animals from the baby's crib to prevent suffocation.  Visit your dentist if you have not gone during your pregnancy. Use a soft toothbrush to brush your teeth and be gentle when you floss. Contact a health care provider if:  You are unsure if you are in labor or if your water has broken.  You become dizzy.  You have mild pelvic cramps, pelvic pressure, or nagging pain in your abdominal area.  You have lower back pain.  You have persistent  nausea, vomiting, or diarrhea.  You have an unusual or bad smelling vaginal discharge.  You have pain when you urinate. Get help right away if:  Your water breaks before 37 weeks.  You have regular contractions less than 5 minutes apart before 37 weeks.  You have a fever.  You are leaking fluid from your vagina.  You have spotting or bleeding from your vagina.  You have severe abdominal pain or cramping.  You have rapid weight loss or weight gain.    You have shortness of breath with chest pain.  You notice sudden or extreme swelling of your face, hands, ankles, feet, or legs.  Your baby makes fewer than 10 movements in 2 hours.  You have severe headaches that do not go away when you take medicine.  You have vision changes. Summary  The third trimester is from week 28 through week 40, months 7 through 9. The third trimester is a time when the unborn baby (fetus) is growing rapidly.  During the third trimester, your discomfort may increase as you and your baby continue to gain weight. You may have abdominal, leg, and back pain, sleeping problems, and an increased need to urinate.  During the third trimester your breasts will keep growing and they will continue to become tender. A yellow fluid (colostrum) may leak from your breasts. This is the first milk you are producing for your baby.  False labor is a condition in which you feel small, irregular tightenings of the muscles in the womb (contractions) that eventually go away. These are called Braxton Hicks contractions. Contractions may last for hours, days, or even weeks before true labor sets in.  Signs of labor can include: abdominal cramps; regular contractions that start at 10 minutes apart and become stronger and more frequent with time; watery or bloody mucus discharge that comes from the vagina; increased pelvic pressure and dull back pain; and leaking of amniotic fluid. This information is not intended to replace advice  given to you by your health care provider. Make sure you discuss any questions you have with your health care provider. Document Released: 02/26/2001 Document Revised: 08/10/2015 Document Reviewed: 05/05/2012 Elsevier Interactive Patient Education  2017 [employer]  

## 2016-12-13 LAB — CBC
Hematocrit: 29.9 % — ABNORMAL LOW (ref 34.0–46.6)
Hemoglobin: 10 g/dL — ABNORMAL LOW (ref 11.1–15.9)
MCH: 30.3 pg (ref 26.6–33.0)
MCHC: 33.4 g/dL (ref 31.5–35.7)
MCV: 91 fL (ref 79–97)
PLATELETS: 260 10*3/uL (ref 150–379)
RBC: 3.3 x10E6/uL — AB (ref 3.77–5.28)
RDW: 14.7 % (ref 12.3–15.4)
WBC: 7.3 10*3/uL (ref 3.4–10.8)

## 2016-12-13 LAB — HIV ANTIBODY (ROUTINE TESTING W REFLEX): HIV SCREEN 4TH GENERATION: NONREACTIVE

## 2016-12-13 LAB — GLUCOSE TOLERANCE, 2 HOURS W/ 1HR
Glucose, 1 hour: 147 mg/dL (ref 65–179)
Glucose, 2 hour: 144 mg/dL (ref 65–152)
Glucose, Fasting: 84 mg/dL (ref 65–91)

## 2016-12-13 LAB — RPR: RPR Ser Ql: NONREACTIVE

## 2016-12-17 ENCOUNTER — Encounter: Payer: Medicaid Other | Admitting: Obstetrics & Gynecology

## 2016-12-23 ENCOUNTER — Ambulatory Visit (HOSPITAL_COMMUNITY): Admission: RE | Admit: 2016-12-23 | Payer: Medicaid Other | Source: Ambulatory Visit

## 2016-12-26 ENCOUNTER — Ambulatory Visit (HOSPITAL_COMMUNITY): Payer: Medicaid Other

## 2016-12-26 ENCOUNTER — Encounter: Payer: Medicaid Other | Admitting: Obstetrics and Gynecology

## 2016-12-30 ENCOUNTER — Ambulatory Visit (INDEPENDENT_AMBULATORY_CARE_PROVIDER_SITE_OTHER): Payer: Medicaid Other | Admitting: Obstetrics and Gynecology

## 2016-12-30 VITALS — BP 105/68 | HR 108 | Wt 176.0 lb

## 2016-12-30 DIAGNOSIS — O10913 Unspecified pre-existing hypertension complicating pregnancy, third trimester: Secondary | ICD-10-CM

## 2016-12-30 DIAGNOSIS — O0993 Supervision of high risk pregnancy, unspecified, third trimester: Secondary | ICD-10-CM

## 2016-12-30 DIAGNOSIS — O0990 Supervision of high risk pregnancy, unspecified, unspecified trimester: Secondary | ICD-10-CM

## 2016-12-30 DIAGNOSIS — Z3009 Encounter for other general counseling and advice on contraception: Secondary | ICD-10-CM | POA: Insufficient documentation

## 2016-12-30 DIAGNOSIS — O10919 Unspecified pre-existing hypertension complicating pregnancy, unspecified trimester: Secondary | ICD-10-CM

## 2016-12-30 NOTE — Progress Notes (Signed)
Subjective:  Amber Haas is a 36 y.o. [id number] at [redacted]w[redacted]d being seen today for ongoing prenatal care.  She is currently monitored for the following issues for this high-risk pregnancy and has Chronic hypertension during pregnancy, antepartum; Supervision of high risk pregnancy, antepartum; Recent history of foreign travel; Late prenatal care; Rubella non-immune status, antepartum; and Unwanted fertility on her problem list.  Patient reports general discomforts of pregnancy.  Contractions: Not present. Vag. Bleeding: None.  Movement: Present. Denies leaking of fluid.   The following portions of the patient's history were reviewed and updated as appropriate: allergies, current medications, past family history, past medical history, past social history, past surgical history and problem list. Problem list updated.  Objective:   Vitals:   12/30/16 1321  BP: 105/68  Pulse: (!) 108  Weight: 176 lb (79.8 kg)    Fetal Status:     Movement: Present     General:  Alert, oriented and cooperative. Patient is in no acute distress.  Skin: Skin is warm and dry. No rash noted.   Cardiovascular: Normal heart rate noted  Respiratory: Normal respiratory effort, no problems with respiration noted  Abdomen: Soft, gravid, appropriate for gestational age. Pain/Pressure: Present     Pelvic:  Cervical exam deferred        Extremities: Normal range of motion.  Edema: None  Mental Status: Normal mood and affect. Normal behavior. Normal judgment and thought content.   Urinalysis:      Assessment and Plan:  Pregnancy: [id number] at [redacted]w[redacted]d  1. Chronic hypertension during pregnancy, antepartum BP stable No meds, continue with BASA Will add BPP to U/S for growth this week and then start twice weekly NST/AFI - [place] MFM FETAL BPP WO NON STRESS; Future  2. Supervision of high risk pregnancy, antepartum Stable  3. Unwanted fertility BTL papers sgined  Preterm labor symptoms and general obstetric  precautions including but not limited to vaginal bleeding, contractions, leaking of fluid and fetal movement were reviewed in detail with the patient. Please refer to After Visit Summary for other counseling recommendations.  Return in about 1 week (around 01/06/2017) for OB visit.   Hermina Staggers, MD

## 2016-12-30 NOTE — Patient Instructions (Signed)
Third Trimester of Pregnancy The third trimester is from week 28 through week 40 (months 7 through 9). The third trimester is a time when the unborn baby (fetus) is growing rapidly. At the end of the ninth month, the fetus is about 20 inches in length and weighs 6-10 pounds. Body changes during your third trimester Your body will continue to go through many changes during pregnancy. The changes vary from woman to woman. During the third trimester:  Your weight will continue to increase. You can expect to gain 25-35 pounds (11-16 kg) by the end of the pregnancy.  You may begin to get stretch marks on your hips, abdomen, and breasts.  You may urinate more often because the fetus is moving lower into your pelvis and pressing on your bladder.  You may develop or continue to have heartburn. This is caused by increased hormones that slow down muscles in the digestive tract.  You may develop or continue to have constipation because increased hormones slow digestion and cause the muscles that push waste through your intestines to relax.  You may develop hemorrhoids. These are swollen veins (varicose veins) in the rectum that can itch or be painful.  You may develop swollen, bulging veins (varicose veins) in your legs.  You may have increased body aches in the pelvis, back, or thighs. This is due to weight gain and increased hormones that are relaxing your joints.  You may have changes in your hair. These can include thickening of your hair, rapid growth, and changes in texture. Some women also have hair loss during or after pregnancy, or hair that feels dry or thin. Your hair will most likely return to normal after your baby is born.  Your breasts will continue to grow and they will continue to become tender. A yellow fluid (colostrum) may leak from your breasts. This is the first milk you are producing for your baby.  Your belly button may stick out.  You may notice more swelling in your hands,  face, or ankles.  You may have increased tingling or numbness in your hands, arms, and legs. The skin on your belly may also feel numb.  You may feel short of breath because of your expanding uterus.  You may have more problems sleeping. This can be caused by the size of your belly, increased need to urinate, and an increase in your body's metabolism.  You may notice the fetus "dropping," or moving lower in your abdomen (lightening).  You may have increased vaginal discharge.  You may notice your joints feel loose and you may have pain around your pelvic bone.  What to expect at prenatal visits You will have prenatal exams every 2 weeks until week 36. Then you will have weekly prenatal exams. During a routine prenatal visit:  You will be weighed to make sure you and the baby are growing normally.  Your blood pressure will be taken.  Your abdomen will be measured to track your baby's growth.  The fetal heartbeat will be listened to.  Any test results from the previous visit will be discussed.  You may have a cervical check near your due date to see if your cervix has softened or thinned (effaced).  You will be tested for Group B streptococcus. This happens between 35 and 37 weeks.  Your health care provider may ask you:  What your birth plan is.  How you are feeling.  If you are feeling the baby move.  If you have had   any abnormal symptoms, such as leaking fluid, bleeding, severe headaches, or abdominal cramping.  If you are using any tobacco products, including cigarettes, chewing tobacco, and electronic cigarettes.  If you have any questions.  Other tests or screenings that may be performed during your third trimester include:  Blood tests that check for low iron levels (anemia).  Fetal testing to check the health, activity level, and growth of the fetus. Testing is done if you have certain medical conditions or if there are problems during the  pregnancy.  Nonstress test (NST). This test checks the health of your baby to make sure there are no signs of problems, such as the baby not getting enough oxygen. During this test, a belt is placed around your belly. The baby is made to move, and its heart rate is monitored during movement.  What is false labor? False labor is a condition in which you feel small, irregular tightenings of the muscles in the womb (contractions) that usually go away with rest, changing position, or drinking water. These are called Braxton Hicks contractions. Contractions may last for hours, days, or even weeks before true labor sets in. If contractions come at regular intervals, become more frequent, increase in intensity, or become painful, you should see your health care provider. What are the signs of labor?  Abdominal cramps.  Regular contractions that start at 10 minutes apart and become stronger and more frequent with time.  Contractions that start on the top of the uterus and spread down to the lower abdomen and back.  Increased pelvic pressure and dull back pain.  A watery or bloody mucus discharge that comes from the vagina.  Leaking of amniotic fluid. This is also known as your "water breaking." It could be a slow trickle or a gush. Let your health care provider know if it has a color or strange odor. If you have any of these signs, call your health care provider right away, even if it is before your due date. Follow these instructions at home: Medicines  Follow your health care provider's instructions regarding medicine use. Specific medicines may be either safe or unsafe to take during pregnancy.  Take a prenatal vitamin that contains at least 600 micrograms (mcg) of folic acid.  If you develop constipation, try taking a stool softener if your health care provider approves. Eating and drinking  Eat a balanced diet that includes fresh fruits and vegetables, whole grains, good sources of protein  such as meat, eggs, or tofu, and low-fat dairy. Your health care provider will help you determine the amount of weight gain that is right for you.  Avoid raw meat and uncooked cheese. These carry germs that can cause birth defects in the baby.  If you have low calcium intake from food, talk to your health care provider about whether you should take a daily calcium supplement.  Eat four or five small meals rather than three large meals a day.  Limit foods that are high in fat and processed sugars, such as fried and sweet foods.  To prevent constipation: ? Drink enough fluid to keep your urine clear or pale yellow. ? Eat foods that are high in fiber, such as fresh fruits and vegetables, whole grains, and beans. Activity  Exercise only as directed by your health care provider. Most women can continue their usual exercise routine during pregnancy. Try to exercise for 30 minutes at least 5 days a week. Stop exercising if you experience uterine contractions.  Avoid heavy   lifting.  Do not exercise in extreme heat or humidity, or at high altitudes.  Wear low-heel, comfortable shoes.  Practice good posture.  You may continue to have sex unless your health care provider tells you otherwise. Relieving pain and discomfort  Take frequent breaks and rest with your legs elevated if you have leg cramps or low back pain.  Take warm sitz baths to soothe any pain or discomfort caused by hemorrhoids. Use hemorrhoid cream if your health care provider approves.  Wear a good support bra to prevent discomfort from breast tenderness.  If you develop varicose veins: ? Wear support pantyhose or compression stockings as told by your healthcare provider. ? Elevate your feet for 15 minutes, 3-4 times a day. Prenatal care  Write down your questions. Take them to your prenatal visits.  Keep all your prenatal visits as told by your health care provider. This is important. Safety  Wear your seat belt at  all times when driving.  Make a list of emergency phone numbers, including numbers for family, friends, the hospital, and police and fire departments. General instructions  Avoid cat litter boxes and soil used by cats. These carry germs that can cause birth defects in the baby. If you have a cat, ask someone to clean the litter box for you.  Do not travel far distances unless it is absolutely necessary and only with the approval of your health care provider.  Do not use hot tubs, steam rooms, or saunas.  Do not drink alcohol.  Do not use any products that contain nicotine or tobacco, such as cigarettes and e-cigarettes. If you need help quitting, ask your health care provider.  Do not use any medicinal herbs or unprescribed drugs. These chemicals affect the formation and growth of the baby.  Do not douche or use tampons or scented sanitary pads.  Do not cross your legs for long periods of time.  To prepare for the arrival of your baby: ? Take prenatal classes to understand, practice, and ask questions about labor and delivery. ? Make a trial run to the hospital. ? Visit the hospital and tour the maternity area. ? Arrange for maternity or paternity leave through employers. ? Arrange for family and friends to take care of pets while you are in the hospital. ? Purchase a rear-facing car seat and make sure you know how to install it in your car. ? Pack your hospital bag. ? Prepare the baby's nursery. Make sure to remove all pillows and stuffed animals from the baby's crib to prevent suffocation.  Visit your dentist if you have not gone during your pregnancy. Use a soft toothbrush to brush your teeth and be gentle when you floss. Contact a health care provider if:  You are unsure if you are in labor or if your water has broken.  You become dizzy.  You have mild pelvic cramps, pelvic pressure, or nagging pain in your abdominal area.  You have lower back pain.  You have persistent  nausea, vomiting, or diarrhea.  You have an unusual or bad smelling vaginal discharge.  You have pain when you urinate. Get help right away if:  Your water breaks before 37 weeks.  You have regular contractions less than 5 minutes apart before 37 weeks.  You have a fever.  You are leaking fluid from your vagina.  You have spotting or bleeding from your vagina.  You have severe abdominal pain or cramping.  You have rapid weight loss or weight gain.    You have shortness of breath with chest pain.  You notice sudden or extreme swelling of your face, hands, ankles, feet, or legs.  Your baby makes fewer than 10 movements in 2 hours.  You have severe headaches that do not go away when you take medicine.  You have vision changes. Summary  The third trimester is from week 28 through week 40, months 7 through 9. The third trimester is a time when the unborn baby (fetus) is growing rapidly.  During the third trimester, your discomfort may increase as you and your baby continue to gain weight. You may have abdominal, leg, and back pain, sleeping problems, and an increased need to urinate.  During the third trimester your breasts will keep growing and they will continue to become tender. A yellow fluid (colostrum) may leak from your breasts. This is the first milk you are producing for your baby.  False labor is a condition in which you feel small, irregular tightenings of the muscles in the womb (contractions) that eventually go away. These are called Braxton Hicks contractions. Contractions may last for hours, days, or even weeks before true labor sets in.  Signs of labor can include: abdominal cramps; regular contractions that start at 10 minutes apart and become stronger and more frequent with time; watery or bloody mucus discharge that comes from the vagina; increased pelvic pressure and dull back pain; and leaking of amniotic fluid. This information is not intended to replace advice  given to you by your health care provider. Make sure you discuss any questions you have with your health care provider. Document Released: 02/26/2001 Document Revised: 08/10/2015 Document Reviewed: 05/05/2012 Elsevier Interactive Patient Education  2017 [employer]  

## 2017-01-03 ENCOUNTER — Other Ambulatory Visit: Payer: Self-pay | Admitting: Obstetrics and Gynecology

## 2017-01-03 ENCOUNTER — Encounter (HOSPITAL_COMMUNITY): Payer: Self-pay

## 2017-01-03 ENCOUNTER — Ambulatory Visit (HOSPITAL_COMMUNITY)
Admission: RE | Admit: 2017-01-03 | Discharge: 2017-01-03 | Disposition: A | Discharge status: Home / Self Care | Payer: Medicaid Other | Source: Ambulatory Visit | Attending: Obstetrics & Gynecology | Admitting: Obstetrics & Gynecology

## 2017-01-03 DIAGNOSIS — O10019 Pre-existing essential hypertension complicating pregnancy, unspecified trimester: Secondary | ICD-10-CM

## 2017-01-03 DIAGNOSIS — O99213 Obesity complicating pregnancy, third trimester: Secondary | ICD-10-CM | POA: Diagnosis not present

## 2017-01-03 DIAGNOSIS — O10913 Unspecified pre-existing hypertension complicating pregnancy, third trimester: Secondary | ICD-10-CM | POA: Diagnosis not present

## 2017-01-03 DIAGNOSIS — O0933 Supervision of pregnancy with insufficient antenatal care, third trimester: Secondary | ICD-10-CM

## 2017-01-03 DIAGNOSIS — Z3A32 32 weeks gestation of pregnancy: Secondary | ICD-10-CM

## 2017-01-03 DIAGNOSIS — O10919 Unspecified pre-existing hypertension complicating pregnancy, unspecified trimester: Secondary | ICD-10-CM

## 2017-01-13 ENCOUNTER — Encounter: Payer: Medicaid Other | Admitting: Obstetrics and Gynecology

## 2017-01-13 ENCOUNTER — Other Ambulatory Visit: Payer: Medicaid Other

## 2017-01-16 ENCOUNTER — Ambulatory Visit (INDEPENDENT_AMBULATORY_CARE_PROVIDER_SITE_OTHER): Payer: Medicaid Other

## 2017-01-16 VITALS — BP 117/70 | HR 99 | Wt 176.5 lb

## 2017-01-16 DIAGNOSIS — O10919 Unspecified pre-existing hypertension complicating pregnancy, unspecified trimester: Secondary | ICD-10-CM

## 2017-01-16 DIAGNOSIS — O10913 Unspecified pre-existing hypertension complicating pregnancy, third trimester: Secondary | ICD-10-CM

## 2017-01-16 NOTE — Progress Notes (Signed)
I have reviewed this chart and agree with the RN assessment and management.   Reactive NST.  Baldemar LenisK. Meryl Emilyrose Darrah, M.D. Attending Obstetrician & Gynecologist, [facility], [facility]

## 2017-01-16 NOTE — Progress Notes (Signed)
Nurse visit for NST only dx: CHTN. NST - reactive today. AFI/NST scheduled Monday

## 2017-01-20 ENCOUNTER — Other Ambulatory Visit: Payer: Medicaid Other

## 2017-01-20 ENCOUNTER — Encounter: Payer: Medicaid Other | Admitting: Obstetrics and Gynecology

## 2017-01-23 ENCOUNTER — Other Ambulatory Visit: Payer: Medicaid Other

## 2017-02-04 ENCOUNTER — Ambulatory Visit (INDEPENDENT_AMBULATORY_CARE_PROVIDER_SITE_OTHER): Payer: Medicaid Other | Admitting: Obstetrics and Gynecology

## 2017-02-04 ENCOUNTER — Encounter: Payer: Self-pay | Admitting: Obstetrics and Gynecology

## 2017-02-04 ENCOUNTER — Other Ambulatory Visit: Payer: Medicaid Other

## 2017-02-04 ENCOUNTER — Other Ambulatory Visit (HOSPITAL_COMMUNITY)
Admission: RE | Admit: 2017-02-04 | Discharge: 2017-02-04 | Disposition: A | Discharge status: Home / Self Care | Payer: Medicaid Other | Source: Ambulatory Visit | Attending: Obstetrics and Gynecology | Admitting: Obstetrics and Gynecology

## 2017-02-04 VITALS — BP 124/75 | HR 101 | Wt 180.2 lb

## 2017-02-04 DIAGNOSIS — Z3009 Encounter for other general counseling and advice on contraception: Secondary | ICD-10-CM | POA: Diagnosis not present

## 2017-02-04 DIAGNOSIS — O0990 Supervision of high risk pregnancy, unspecified, unspecified trimester: Secondary | ICD-10-CM

## 2017-02-04 DIAGNOSIS — O0993 Supervision of high risk pregnancy, unspecified, third trimester: Secondary | ICD-10-CM | POA: Diagnosis not present

## 2017-02-04 DIAGNOSIS — O10913 Unspecified pre-existing hypertension complicating pregnancy, third trimester: Secondary | ICD-10-CM | POA: Diagnosis not present

## 2017-02-04 DIAGNOSIS — O10919 Unspecified pre-existing hypertension complicating pregnancy, unspecified trimester: Secondary | ICD-10-CM

## 2017-02-04 DIAGNOSIS — Z3A37 37 weeks gestation of pregnancy: Secondary | ICD-10-CM | POA: Diagnosis not present

## 2017-02-04 LAB — OB RESULTS CONSOLE GC/CHLAMYDIA: GC PROBE AMP, GENITAL: NEGATIVE

## 2017-02-04 NOTE — Progress Notes (Signed)
Subjective:  Grandville Silosiara Olayiwola-Olosun is a 36 y.o. [id number] at [redacted]w[redacted]d being seen today for ongoing prenatal care.  She is currently monitored for the following issues for this high-risk pregnancy and has Chronic hypertension during pregnancy, antepartum; Supervision of high risk pregnancy, antepartum; Recent history of foreign travel; Late prenatal care; Rubella non-immune status, antepartum; and Unwanted fertility on their problem list.  Patient reports no complaints.  Contractions: Irregular. Vag. Bleeding: None.  Movement: Present. Denies leaking of fluid.   The following portions of the patient's history were reviewed and updated as appropriate: allergies, current medications, past family history, past medical history, past social history, past surgical history and problem list. Problem list updated.  Objective:   Vitals:   02/04/17 1527  BP: 124/75  Pulse: (!) 101  Weight: 180 lb 3.2 oz (81.7 kg)    Fetal Status: Fetal Heart Rate (bpm): 156   Movement: Present  Presentation: Vertex  General:  Alert, oriented and cooperative. Patient is in no acute distress.  Skin: Skin is warm and dry. No rash noted.   Cardiovascular: Normal heart rate noted  Respiratory: Normal respiratory effort, no problems with respiration noted  Abdomen: Soft, gravid, appropriate for gestational age. Pain/Pressure: Absent     Pelvic:  Cervical exam performed        Extremities: Normal range of motion.  Edema: None  Mental Status: Normal mood and affect. Normal behavior. Normal judgment and thought content.   Urinalysis:      Assessment and Plan:  Pregnancy: [id number] at [redacted]w[redacted]d  1. Supervision of high risk pregnancy, antepartum Stable Labor precautions - Strep Gp B NAA - Cervicovaginal ancillary only  2. Unwanted fertility BTL papers signed  3. Chronic hypertension during pregnancy, antepartum BP stable without meds Off BASA Antenatal testing scheduled - [place] MFM FETAL BPP WO NON STRESS; Future - [place] MFM  OB FOLLOW UP; Future - Fetal nonstress test - US OB Limited  Term labor symptoms and general obstetric precautions including but not limited to vaginal bleeding, contractions, leaking of fluid and fetal movement were reviewed in detail with the patient. Please refer to After Visit Summary for other counseling recommendations.  Return in about 1 week (around 02/11/2017) for OB visit.   Hermina StaggersErvin, Jarquavious Fentress L, MD

## 2017-02-04 NOTE — Patient Instructions (Signed)
Vaginal Delivery Vaginal delivery means that you will give birth by pushing your baby out of your birth canal (vagina). A team of health care providers will help you before, during, and after vaginal delivery. Birth experiences are unique for every woman and every pregnancy, and birth experiences vary depending on where you choose to give birth. What should I do to prepare for my baby's birth? Before your baby is born, it is important to talk with your health care provider about:  Your labor and delivery preferences. These may include: ? Medicines that you may be given. ? How you will manage your pain. This might include non-medical pain relief techniques or injectable pain relief such as epidural analgesia. ? How you and your baby will be monitored during labor and delivery. ? Who may be in the labor and delivery room with you. ? Your feelings about surgical delivery of your baby (cesarean delivery, or C-section) if this becomes necessary. ? Your feelings about receiving donated blood through an IV tube (blood transfusion) if this becomes necessary.  Whether you are able: ? To take pictures or videos of the birth. ? To eat during labor and delivery. ? To move around, walk, or change positions during labor and delivery.  What to expect after your baby is born, such as: ? Whether delayed umbilical cord clamping and cutting is offered. ? Who will care for your baby right after birth. ? Medicines or tests that may be recommended for your baby. ? Whether breastfeeding is supported in your hospital or birth center. ? How long you will be in the hospital or birth center.  How any medical conditions you have may affect your baby or your labor and delivery experience.  To prepare for your baby's birth, you should also:  Attend all of your health care visits before delivery (prenatal visits) as recommended by your health care provider. This is important.  Prepare your home for your baby's  arrival. Make sure that you have: ? Diapers. ? Baby clothing. ? Feeding equipment. ? Safe sleeping arrangements for you and your baby.  Install a car seat in your vehicle. Have your car seat checked by a certified car seat installer to make sure that it is installed safely.  Think about who will help you with your new baby at home for at least the first several weeks after delivery.  What can I expect when I arrive at the birth center or hospital? Once you are in labor and have been admitted into the hospital or birth center, your health care provider may:  Review your pregnancy history and any concerns you have.  Insert an IV tube into one of your veins. This is used to give you fluids and medicines.  Check your blood pressure, pulse, temperature, and heart rate (vital signs).  Check whether your bag of water (amniotic sac) has broken (ruptured).  Talk with you about your birth plan and discuss pain control options.  Monitoring Your health care provider may monitor your contractions (uterine monitoring) and your baby's heart rate (fetal monitoring). You may need to be monitored:  Often, but not continuously (intermittently).  All the time or for long periods at a time (continuously). Continuous monitoring may be needed if: ? You are taking certain medicines, such as medicine to relieve pain or make your contractions stronger. ? You have pregnancy or labor complications.  Monitoring may be done by:  Placing a special stethoscope or a handheld monitoring device on your abdomen to   check your baby's heartbeat, and feeling your abdomen for contractions. This method of monitoring does not continuously record your baby's heartbeat or your contractions.  Placing monitors on your abdomen (external monitors) to record your baby's heartbeat and the frequency and length of contractions. You may not have to wear external monitors all the time.  Placing monitors inside of your uterus  (internal monitors) to record your baby's heartbeat and the frequency, length, and strength of your contractions. ? Your health care provider may use internal monitors if he or she needs more information about the strength of your contractions or your baby's heart rate. ? Internal monitors are put in place by passing a thin, flexible wire through your vagina and into your uterus. Depending on the type of monitor, it may remain in your uterus or on your baby's head until birth. ? Your health care provider will discuss the benefits and risks of internal monitoring with you and will ask for your permission before inserting the monitors.  Telemetry. This is a type of continuous monitoring that can be done with external or internal monitors. Instead of having to stay in bed, you are able to move around during telemetry. Ask your health care provider if telemetry is an option for you.  Physical exam Your health care provider may perform a physical exam. This may include:  Checking whether your baby is positioned: ? With the head toward your vagina (head-down). This is most common. ? With the head toward the top of your uterus (head-up or breech). If your baby is in a breech position, your health care provider may try to turn your baby to a head-down position so you can deliver vaginally. If it does not seem that your baby can be born vaginally, your provider may recommend surgery to deliver your baby. In rare cases, you may be able to deliver vaginally if your baby is head-up (breech delivery). ? Lying sideways (transverse). Babies that are lying sideways cannot be delivered vaginally.  Checking your cervix to determine: ? Whether it is thinning out (effacing). ? Whether it is opening up (dilating). ? How low your baby has moved into your birth canal.  What are the three stages of labor and delivery?  Normal labor and delivery is divided into the following three stages: Stage 1  Stage 1 is the  longest stage of labor, and it can last for hours or days. Stage 1 includes: ? Early labor. This is when contractions may be irregular, or regular and mild. Generally, early labor contractions are more than 10 minutes apart. ? Active labor. This is when contractions get longer, more regular, more frequent, and more intense. ? The transition phase. This is when contractions happen very close together, are very intense, and may last longer than during any other part of labor.  Contractions generally feel mild, infrequent, and irregular at first. They get stronger, more frequent (about every 2-3 minutes), and more regular as you progress from early labor through active labor and transition.  Many women progress through stage 1 naturally, but you may need help to continue making progress. If this happens, your health care provider may talk with you about: ? Rupturing your amniotic sac if it has not ruptured yet. ? Giving you medicine to help make your contractions stronger and more frequent.  Stage 1 ends when your cervix is completely dilated to 4 inches (10 cm) and completely effaced. This happens at the end of the transition phase. Stage 2  Once   your cervix is completely effaced and dilated to 4 inches (10 cm), you may start to feel an urge to push. It is common for the body to naturally take a rest before feeling the urge to push, especially if you received an epidural or certain other pain medicines. This rest period may last for up to 1-2 hours, depending on your unique labor experience.  During stage 2, contractions are generally less painful, because pushing helps relieve contraction pain. Instead of contraction pain, you may feel stretching and burning pain, especially when the widest part of your baby's head passes through the vaginal opening (crowning).  Your health care provider will closely monitor your pushing progress and your baby's progress through the vagina during stage 2.  Your  health care provider may massage the area of skin between your vaginal opening and anus (perineum) or apply warm compresses to your perineum. This helps it stretch as the baby's head starts to crown, which can help prevent perineal tearing. ? In some cases, an incision may be made in your perineum (episiotomy) to allow the baby to pass through the vaginal opening. An episiotomy helps to make the opening of the vagina larger to allow more room for the baby to fit through.  It is very important to breathe and focus so your health care provider can control the delivery of your baby's head. Your health care provider may have you decrease the intensity of your pushing, to help prevent perineal tearing.  After delivery of your baby's head, the shoulders and the rest of the body generally deliver very quickly and without difficulty.  Once your baby is delivered, the umbilical cord may be cut right away, or this may be delayed for 1-2 minutes, depending on your baby's health. This may vary among health care providers, hospitals, and birth centers.  If you and your baby are healthy enough, your baby may be placed on your chest or abdomen to help maintain the baby's temperature and to help you bond with each other. Some mothers and babies start breastfeeding at this time. Your health care team will dry your baby and help keep your baby warm during this time.  Your baby may need immediate care if he or she: ? Showed signs of distress during labor. ? Has a medical condition. ? Was born too early (prematurely). ? Had a bowel movement before birth (meconium). ? Shows signs of difficulty transitioning from being inside the uterus to being outside of the uterus. If you are planning to breastfeed, your health care team will help you begin a feeding. Stage 3  The third stage of labor starts immediately after the birth of your baby and ends after you deliver the placenta. The placenta is an organ that develops  during pregnancy to provide oxygen and nutrients to your baby in the womb.  Delivering the placenta may require some pushing, and you may have mild contractions. Breastfeeding can stimulate contractions to help you deliver the placenta.  After the placenta is delivered, your uterus should tighten (contract) and become firm. This helps to stop bleeding in your uterus. To help your uterus contract and to control bleeding, your health care provider may: ? Give you medicine by injection, through an IV tube, by mouth, or through your rectum (rectally). ? Massage your abdomen or perform a vaginal exam to remove any blood clots that are left in your uterus. ? Empty your bladder by placing a thin, flexible tube (catheter) into your bladder. ? Encourage   you to breastfeed your baby. After labor is over, you and your baby will be monitored closely to ensure that you are both healthy until you are ready to go home. Your health care team will teach you how to care for yourself and your baby. This information is not intended to replace advice given to you by your health care provider. Make sure you discuss any questions you have with your health care provider. Document Released: 12/12/2007 Document Revised: 09/22/2015 Document Reviewed: 03/19/2015 Elsevier Interactive Patient Education  2018 Elsevier Inc.  

## 2017-02-04 NOTE — Progress Notes (Signed)
Patient reports good fetal movement and irregular contractions. Pt states that she missed previous appts due to being tired and schedule changes.

## 2017-02-05 ENCOUNTER — Telehealth: Payer: Self-pay

## 2017-02-05 LAB — CERVICOVAGINAL ANCILLARY ONLY
BACTERIAL VAGINITIS: NEGATIVE
Candida vaginitis: NEGATIVE
Chlamydia: NEGATIVE
NEISSERIA GONORRHEA: NEGATIVE
TRICH (WINDOWPATH): NEGATIVE

## 2017-02-05 NOTE — Telephone Encounter (Signed)
Pt called and left message requesting lab results for GBS. Results are not back yet. Left message on pt vm to return call to office

## 2017-02-05 NOTE — Telephone Encounter (Signed)
Left patient a voicemail with appointment dates and time.  Advised patient that [facility] and then [facility].  Also informed patient that if she has any questions she can call the office on Monday November 26 as the office will be closed on Thursday November 22 and 23.

## 2017-02-06 ENCOUNTER — Other Ambulatory Visit: Payer: Self-pay

## 2017-02-06 ENCOUNTER — Encounter (HOSPITAL_COMMUNITY): Payer: Self-pay | Admitting: *Deleted

## 2017-02-06 ENCOUNTER — Inpatient Hospital Stay (HOSPITAL_COMMUNITY)
Admission: AD | Admit: 2017-02-06 | Discharge: 2017-02-06 | Disposition: A | Discharge status: Home / Self Care | Payer: Medicaid Other | Source: Ambulatory Visit | Attending: Obstetrics and Gynecology | Admitting: Obstetrics and Gynecology

## 2017-02-06 DIAGNOSIS — O479 False labor, unspecified: Secondary | ICD-10-CM

## 2017-02-06 DIAGNOSIS — O471 False labor at or after 37 completed weeks of gestation: Secondary | ICD-10-CM | POA: Insufficient documentation

## 2017-02-06 DIAGNOSIS — Z3A39 39 weeks gestation of pregnancy: Secondary | ICD-10-CM | POA: Insufficient documentation

## 2017-02-06 LAB — STREP GP B NAA: Strep Gp B NAA: NEGATIVE

## 2017-02-06 NOTE — Progress Notes (Signed)
I have communicated with Dr Harlow Ohmsejele and reviewed vital signs:  Vitals:   02/06/17 0844 02/06/17 0940  BP:  130/74  Pulse:  92  Resp:    Temp:    SpO2: 100%     Vaginal exam:  Dilation: 4.5 Effacement (%): 70 Cervical Position: Posterior Station: -3 Exam by:: Maryagnes Carrasco,   Also reviewed contraction pattern and that non-stress test is reactive.  It has been documented that patient is contracting every 4-6 minutes with no cervical change over 1 hour not indicating active labor.  Patient denies any other complaints.  Based on this report provider has given order for discharge.  A discharge order and diagnosis entered by a provider.   Labor discharge instructions reviewed with patient.

## 2017-02-06 NOTE — MAU Note (Signed)
Urine in lab. Culture collected

## 2017-02-06 NOTE — MAU Note (Signed)
Contractions started at midnight, now ev ery 6 min. No bleeding or leaking.  Was 2-743min when last checked.  6th baby.

## 2017-02-06 NOTE — Discharge Instructions (Signed)
Return to [facility]U if your contractions get stronger and closer together, OR if your water breaks.    Braxton Hicks Contractions Contractions of the uterus can occur throughout pregnancy, but they are not always a sign that you are in labor. You may have practice contractions called Braxton Hicks contractions. These false labor contractions are sometimes confused with true labor. What are Deberah PeltonBraxton Hicks contractions? Braxton Hicks contractions are tightening movements that occur in the muscles of the uterus before labor. Unlike true labor contractions, these contractions do not result in opening (dilation) and thinning of the cervix. Toward the end of pregnancy (32-34 weeks), Braxton Hicks contractions can happen more often and may become stronger. These contractions are sometimes difficult to tell apart from true labor because they can be very uncomfortable. You should not feel embarrassed if you go to the hospital with false labor. Sometimes, the only way to tell if you are in true labor is for your health care provider to look for changes in the cervix. The health care provider will do a physical exam and may monitor your contractions. If you are not in true labor, the exam should show that your cervix is not dilating and your water has not broken. If there are no prenatal problems or other health problems associated with your pregnancy, it is completely safe for you to be sent home with false labor. You may continue to have Braxton Hicks contractions until you go into true labor. How can I tell the difference between true labor and false labor?  Differences ? False labor ? Contractions last 30-70 seconds.: Contractions are usually shorter and not as strong as true labor contractions. ? Contractions become very regular.: Contractions are usually irregular. ? Discomfort is usually felt in the top of the uterus, and it spreads to the lower abdomen and low back.: Contractions are often felt in the front of  the lower abdomen and in the groin. ? Contractions do not go away with walking.: Contractions may go away when you walk around or change positions while lying down. ? Contractions usually become more intense and increase in frequency.: Contractions get weaker and are shorter-lasting as time goes on. ? The cervix dilates and gets thinner.: The cervix usually does not dilate or become thin. Follow these instructions at home:  Take over-the-counter and prescription medicines only as told by your health care provider.  Keep up with your usual exercises and follow other instructions from your health care provider.  Eat and drink lightly if you think you are going into labor.  If Braxton Hicks contractions are making you uncomfortable: ? Change your position from lying down or resting to walking, or change from walking to resting. ? Sit and rest in a tub of warm water. ? Drink enough fluid to keep your urine clear or pale yellow. Dehydration may cause these contractions. ? Do slow and deep breathing several times an hour.  Keep all follow-up prenatal visits as told by your health care provider. This is important. Contact a health care provider if:  You have a fever.  You have continuous pain in your abdomen. Get help right away if:  Your contractions become stronger, more regular, and closer together.  You have fluid leaking or gushing from your vagina.  You pass blood-tinged mucus (bloody show).  You have bleeding from your vagina.  You have low back pain that you never had before.  You feel your babys head pushing down and causing pelvic pressure.  Your baby  is not moving inside you as much as it used to. Summary  Contractions that occur before labor are called Braxton Hicks contractions, false labor, or practice contractions.  Braxton Hicks contractions are usually shorter, weaker, farther apart, and less regular than true labor contractions. True labor contractions usually  become progressively stronger and regular and they become more frequent.  Manage discomfort from [facility] contractions by changing position, resting in a warm bath, drinking plenty of water, or practicing deep breathing. This information is not intended to replace advice given to you by your health care provider. Make sure you discuss any questions you have with your health care provider. Document Released: 03/04/2005 Document Revised: 01/22/2016 Document Reviewed: 01/22/2016 Elsevier Interactive Patient Education  2017 [employer]

## 2017-02-12 ENCOUNTER — Encounter (HOSPITAL_COMMUNITY): Payer: Self-pay

## 2017-02-12 ENCOUNTER — Ambulatory Visit (HOSPITAL_COMMUNITY)
Admission: RE | Admit: 2017-02-12 | Discharge: 2017-02-12 | Disposition: A | Discharge status: Home / Self Care | Payer: Medicaid Other | Source: Ambulatory Visit | Attending: Obstetrics and Gynecology | Admitting: Obstetrics and Gynecology

## 2017-02-12 ENCOUNTER — Ambulatory Visit (INDEPENDENT_AMBULATORY_CARE_PROVIDER_SITE_OTHER): Payer: Medicaid Other | Admitting: Obstetrics and Gynecology

## 2017-02-12 ENCOUNTER — Encounter: Payer: Self-pay | Admitting: Obstetrics and Gynecology

## 2017-02-12 VITALS — BP 112/73 | HR 96 | Wt 183.0 lb

## 2017-02-12 DIAGNOSIS — O10913 Unspecified pre-existing hypertension complicating pregnancy, third trimester: Secondary | ICD-10-CM | POA: Diagnosis present

## 2017-02-12 DIAGNOSIS — O10919 Unspecified pre-existing hypertension complicating pregnancy, unspecified trimester: Secondary | ICD-10-CM

## 2017-02-12 DIAGNOSIS — O9989 Other specified diseases and conditions complicating pregnancy, childbirth and the puerperium: Secondary | ICD-10-CM

## 2017-02-12 DIAGNOSIS — O0933 Supervision of pregnancy with insufficient antenatal care, third trimester: Secondary | ICD-10-CM

## 2017-02-12 DIAGNOSIS — O0993 Supervision of high risk pregnancy, unspecified, third trimester: Secondary | ICD-10-CM

## 2017-02-12 DIAGNOSIS — Z283 Underimmunization status: Secondary | ICD-10-CM

## 2017-02-12 DIAGNOSIS — O09899 Supervision of other high risk pregnancies, unspecified trimester: Secondary | ICD-10-CM

## 2017-02-12 DIAGNOSIS — O0990 Supervision of high risk pregnancy, unspecified, unspecified trimester: Secondary | ICD-10-CM

## 2017-02-12 DIAGNOSIS — Z3A38 38 weeks gestation of pregnancy: Secondary | ICD-10-CM | POA: Diagnosis not present

## 2017-02-12 DIAGNOSIS — O0930 Supervision of pregnancy with insufficient antenatal care, unspecified trimester: Secondary | ICD-10-CM

## 2017-02-12 DIAGNOSIS — Z3009 Encounter for other general counseling and advice on contraception: Secondary | ICD-10-CM

## 2017-02-12 NOTE — Progress Notes (Signed)
Pt states she went to [facility]U for contractions on Thanksgiving. Pt desires to have her cx checked today.

## 2017-02-12 NOTE — Patient Instructions (Signed)
Places to have your son circumcised:    [facility] [phone] $[phone] while you are in hospital  [facility] [phone] $[phone] by 4 wks  [facility] [phone] $[phone] by 2 wks  [facility] [phone] $[phone] by 7 days [facility] [phone] $[phone] by 4 wks  These prices sometimes change but are roughly what you can expect to pay. Please call and confirm pricing.   Circumcision is considered an elective/non-medically necessary procedure. There are many reasons parents decide to have their sons circumsized. During the first year of life circumcised males have a reduced risk of urinary tract infections but after this year the rates between circumcised males and uncircumcised males are the same.  It is safe to have your son circumcised outside of the hospital and the places above perform them regularly.   

## 2017-02-12 NOTE — Progress Notes (Signed)
   PRENATAL VISIT NOTE  Subjective:  Amber Haas is a 36 y.o. [id number] at 91w4dbeing seen today for ongoing prenatal care.  She is currently monitored for the following issues for this high-risk pregnancy and has Chronic hypertension during pregnancy, antepartum; Supervision of high risk pregnancy, antepartum; Recent history of foreign travel; Late prenatal care; Rubella non-immune status, antepartum; and Unwanted fertility on their problem list.  Patient reports significant back pain and some contractions.  Contractions: Irregular. Vag. Bleeding: None.  Movement: Present. Denies leaking of fluid.   The following portions of the patient's history were reviewed and updated as appropriate: allergies, current medications, past family history, past medical history, past social history, past surgical history and problem list. Problem list updated.  Objective:   Vitals:   02/12/17 1517  BP: 112/73  Pulse: 96  Weight: 183 lb (83 kg)    Fetal Status: Fetal Heart Rate (bpm): 150   Movement: Present     General:  Alert, oriented and cooperative. Patient is in no acute distress.  Skin: Skin is warm and dry. No rash noted.   Cardiovascular: Normal heart rate noted  Respiratory: Normal respiratory effort, no problems with respiration noted  Abdomen: Soft, gravid, appropriate for gestational age.  Pain/Pressure: Absent     Pelvic: Cervical exam performed      3/50/-2  Extremities: Normal range of motion.  Edema: None  Mental Status:  Normal mood and affect. Normal behavior. Normal judgment and thought content.   Assessment and Plan:  Pregnancy: [id number] 358w4d1. Supervision of high risk pregnancy, antepartum For IOL potentially 39 weeks [facility] lab[facility]  2. Chronic hypertension during pregnancy, antepartum Today EFW 62nd%tile, AFI 17 cm Well controlled, no meds  3. Late prenatal care  4. Unwanted fertility For BTL  5. Rubella non-immune status,  antepartum Needs MMR pp  Term labor symptoms and general obstetric precautions including but not limited to vaginal bleeding, contractions, leaking of fluid and fetal movement were reviewed in detail with the patient. Please refer to After Visit Summary for other counseling recommendations.  Return in about 1 week (around 02/19/2017) for OB visit (MD).   KeSloan LeiterMD

## 2017-02-15 ENCOUNTER — Telehealth: Payer: Self-pay | Admitting: Obstetrics and Gynecology

## 2017-02-15 NOTE — Telephone Encounter (Signed)
Called patient regarding potential induction today. No answer.    Baldemar LenisK. Meryl Davis, M.D. Attending Obstetrician & Gynecologist, [facility], [facility]

## 2017-02-17 ENCOUNTER — Telehealth (HOSPITAL_COMMUNITY): Payer: Self-pay | Admitting: *Deleted

## 2017-02-17 ENCOUNTER — Telehealth: Payer: Self-pay

## 2017-02-17 NOTE — Telephone Encounter (Signed)
Preadmission screen  

## 2017-02-17 NOTE — Telephone Encounter (Signed)
Pt states that she has an umbilical hernia before pregnancy. States that she is now having pain in the hernia. She also states that now she has noticed a protruding vein underneath her belly button. Pt states that she took some ibuprofen twice for pain, which did help. Please advise

## 2017-02-18 ENCOUNTER — Inpatient Hospital Stay (HOSPITAL_COMMUNITY)
Admission: AD | Admit: 2017-02-18 | Discharge: 2017-02-20 | DRG: 797 | Disposition: A | Discharge status: Home / Self Care | Payer: Medicaid Other | Source: Ambulatory Visit | Attending: Family Medicine | Admitting: Family Medicine

## 2017-02-18 ENCOUNTER — Encounter (HOSPITAL_COMMUNITY): Payer: Self-pay | Admitting: *Deleted

## 2017-02-18 ENCOUNTER — Telehealth: Payer: Self-pay

## 2017-02-18 ENCOUNTER — Other Ambulatory Visit: Payer: Self-pay | Admitting: Certified Nurse Midwife

## 2017-02-18 DIAGNOSIS — Z3A39 39 weeks gestation of pregnancy: Secondary | ICD-10-CM | POA: Diagnosis not present

## 2017-02-18 DIAGNOSIS — O326XX0 Maternal care for compound presentation, not applicable or unspecified: Principal | ICD-10-CM | POA: Diagnosis present

## 2017-02-18 DIAGNOSIS — Z302 Encounter for sterilization: Secondary | ICD-10-CM

## 2017-02-18 DIAGNOSIS — O1002 Pre-existing essential hypertension complicating childbirth: Secondary | ICD-10-CM | POA: Diagnosis present

## 2017-02-18 DIAGNOSIS — O479 False labor, unspecified: Secondary | ICD-10-CM

## 2017-02-18 DIAGNOSIS — Z3483 Encounter for supervision of other normal pregnancy, third trimester: Secondary | ICD-10-CM | POA: Diagnosis present

## 2017-02-18 DIAGNOSIS — O1092 Unspecified pre-existing hypertension complicating childbirth: Secondary | ICD-10-CM | POA: Diagnosis not present

## 2017-02-18 HISTORY — DX: Unspecified hereditary corneal dystrophies: H18.50

## 2017-02-18 HISTORY — DX: Unspecified hereditary corneal dystrophies, unspecified eye: H18.509

## 2017-02-18 HISTORY — DX: Other specified dorsopathies, lumbar region: M53.86

## 2017-02-18 HISTORY — DX: Anemia, unspecified: D64.9

## 2017-02-18 HISTORY — DX: Headache, unspecified: R51.9

## 2017-02-18 HISTORY — DX: Headache: R51

## 2017-02-18 LAB — URINALYSIS, ROUTINE W REFLEX MICROSCOPIC
BILIRUBIN URINE: NEGATIVE
Glucose, UA: NEGATIVE mg/dL
HGB URINE DIPSTICK: NEGATIVE
Ketones, ur: 5 mg/dL — AB
Leukocytes, UA: NEGATIVE
NITRITE: NEGATIVE
PROTEIN: 30 mg/dL — AB
Specific Gravity, Urine: 1.023 (ref 1.005–1.030)
pH: 6 (ref 5.0–8.0)

## 2017-02-18 MED ORDER — FENTANYL CITRATE (PF) 100 MCG/2ML IJ SOLN: 50.0000 ug | INTRAMUSCULAR | Status: DC | PRN

## 2017-02-18 MED ORDER — FLEET ENEMA 7-19 GM/118ML RE ENEM: 1.0000 | ENEMA | RECTAL | Status: DC | PRN

## 2017-02-18 MED ORDER — OXYTOCIN BOLUS FROM INFUSION
500.0000 mL | Freq: Once | INTRAVENOUS | Status: AC
  Administered 2017-02-19: 500 mL via INTRAVENOUS

## 2017-02-18 MED ORDER — OXYTOCIN 40 UNITS IN LACTATED RINGERS INFUSION - SIMPLE MED
2.5000 [IU]/h | INTRAVENOUS | Status: DC
  Administered 2017-02-19: 2.5 [IU]/h via INTRAVENOUS
  Filled 2017-02-18: qty 1000

## 2017-02-18 MED ORDER — ONDANSETRON HCL 4 MG/2ML IJ SOLN: 4.0000 mg | Freq: Four times a day (QID) | INTRAMUSCULAR | Status: DC | PRN

## 2017-02-18 MED ORDER — SOD CITRATE-CITRIC ACID 500-334 MG/5ML PO SOLN
30.0000 mL | ORAL | Status: DC | PRN
  Administered 2017-02-19: 30 mL via ORAL
  Filled 2017-02-18: qty 15

## 2017-02-18 MED ORDER — LACTATED RINGERS IV SOLN
INTRAVENOUS | Status: DC
  Administered 2017-02-18 – 2017-02-19 (×4): via INTRAVENOUS

## 2017-02-18 MED ORDER — LIDOCAINE HCL (PF) 1 % IJ SOLN
30.0000 mL | INTRAMUSCULAR | Status: DC | PRN
  Filled 2017-02-18 (×2): qty 30

## 2017-02-18 MED ORDER — LACTATED RINGERS IV SOLN
500.0000 mL | INTRAVENOUS | Status: DC | PRN
  Administered 2017-02-19: 500 mL via INTRAVENOUS

## 2017-02-18 MED ORDER — ACETAMINOPHEN 325 MG PO TABS: 650.0000 mg | ORAL_TABLET | ORAL | Status: DC | PRN

## 2017-02-18 MED ORDER — OXYCODONE-ACETAMINOPHEN 5-325 MG PO TABS: 1.0000 | ORAL_TABLET | ORAL | Status: DC | PRN

## 2017-02-18 MED ORDER — HYDROXYZINE HCL 50 MG PO TABS: 50.0000 mg | ORAL_TABLET | Freq: Four times a day (QID) | ORAL | Status: DC | PRN

## 2017-02-18 MED ORDER — OXYCODONE-ACETAMINOPHEN 5-325 MG PO TABS: 2.0000 | ORAL_TABLET | ORAL | Status: DC | PRN

## 2017-02-18 NOTE — Telephone Encounter (Signed)
Left detailed message informing pt on vm 

## 2017-02-18 NOTE — Telephone Encounter (Signed)
-----   Message from Conan BowensKelly M Davis, MD sent at 02/18/2017  9:00 AM EST ----- If she is having significant pain, she should present to ED.

## 2017-02-18 NOTE — MAU Note (Signed)
Pt c/o constant low back pain and pressure with intermittent waves of shooting pain that goes up her spine and into her chest causing palpitations. These episodes last for less than a minute and have been going on for the past week. Reports fetal movement and denies any vag bleeding or leaking. She states she has had a headache for the past three days which was not relieved by tylenol or ibuprofen.

## 2017-02-18 NOTE — MAU Provider Note (Signed)
Chief Complaint:  No chief complaint on file.   First Provider Initiated Contact with Patient 02/18/17 2141      HPI: Amber Haas is a 36 y.o. [id number] at [redacted]w[redacted]d who presents to MAU reporting back pain and vaginal pressure. She has been having intermittent back pain for the last week. Pain has now become progressively worse. Pain comes in waves and radiates up her back and to her chest. Pain last for about a minute. Has to stop and breath through the pain. Unsure if it is contractions because she has never had back labor or contraction pain like this.  Denies leakage of fluid, vaginal discharge, or vaginal bleeding. Lost mucous plug recently. Good fetal movement.   Pregnancy Course:  cHTN in pregnancy not on medications  Past Medical History: Past Medical History:  Diagnosis Date  . Anemia   . Corneal dystrophy   . Headache   . History of recurrent UTIs   . Hypertension    never on meds  . Malaria    2016, treated in [place]  . Sciatica associated with disorder of lumbar spine     Past obstetric history: OB History  Gravida Para Term Preterm AB Living  7 5 5   1 5   SAB TAB Ectopic Multiple Live Births  1     0 5    # Outcome Date GA Lbr Len/2nd Weight Sex Delivery Anes PTL Lv  7 Current           6 SAB 03/18/16 [redacted]w[redacted]d    Vag-Spont     5 Term 02/22/15 [redacted]w[redacted]d   F Vag-Spont None  LIV  4 Term 02/13/13 [redacted]w[redacted]d  2.863 kg (6 lb 5 oz) F Vag-Spont EPI  LIV  3 Term 06/07/04 [redacted]w[redacted]d  3.062 kg (6 lb 12 oz) F Vag-Spont EPI  LIV  2 Term 09/24/00 [redacted]w[redacted]d  3.204 kg (7 lb 1 oz) M Vag-Spont EPI  LIV  1 Term 06/23/98 [redacted]w[redacted]d  2.892 kg (6 lb 6 oz) F Vag-Spont EPI  LIV    Obstetric Comments  With last delivery patient states she did have excessive bleeding- not transfusion necessary.    Past Surgical History: Past Surgical History:  Procedure Laterality Date  . axillary disection Left    years ago     Family History: Family History  Problem Relation Age of Onset  . Hypertension Father    . Diabetes Maternal Grandmother   . Heart disease Paternal Grandmother     Social History: Social History   Tobacco Use  . Smoking status: Never Smoker  . Smokeless tobacco: Never Used  Substance Use Topics  . Alcohol use: No    Comment: occasional  . Drug use: No    Allergies: No Known Allergies  Meds:  Medications Prior to Admission  Medication Sig Dispense Refill Last Dose  . acetaminophen (TYLENOL) 500 MG tablet Take 1,000 mg by mouth every 6 (six) hours as needed for moderate pain.    02/18/2017 at Unknown time  . ibuprofen (ADVIL,MOTRIN) 400 MG tablet Take 400 mg by mouth every 6 (six) hours as needed for moderate pain.    Past Week at Unknown time  . Prenat-FeAsp-Meth-FA-DHA w/o A (PRENATE PIXIE) 10-0.6-0.4-200 MG CAPS Take 1 tablet by mouth daily. 30 capsule 12 02/18/2017 at Unknown time  . aspirin 81 MG chewable tablet Chew 1 tablet (81 mg total) by mouth daily. (Patient not taking: Reported on 02/04/2017) 30 tablet 12 More than a month at Unknown time  I have reviewed patient's Past Medical Hx, Surgical Hx, Family Hx, Social Hx, medications and allergies.   ROS:  All systems reviewed and are negative for acute change except as noted in the HPI.   Physical Exam   Patient Vitals for the past 24 hrs:  BP Temp Pulse Resp Height Weight  02/18/17 2054 130/79 98.1 F (36.7 C) 94 16 5\' 2"  (1.575 m) 82.1 kg (181 lb)   Constitutional: Well-developed, well-nourished female in acute distress with contractions.  Cardiovascular: normal rate and rhythm, pulses intact Respiratory: normal rate and effort.  GI: gravid appropriate for gestational age.  MS: Extremities nontender, no edema, normal ROM Neurologic: Alert and oriented x 4.  GU: Neg CVAT. Psych: normal mood and affect  Dilation: 4 Effacement (%): 70 Station: -2, -3 Presentation: Vertex Exam by:: Dr. Doroteo Glassman    Labs: Results for orders placed or performed during the hospital encounter of 02/18/17 (from the  past 24 hour(s))  Urinalysis, Routine w reflex microscopic     Status: Abnormal   Collection Time: 02/18/17  8:30 PM  Result Value Ref Range   Color, Urine YELLOW YELLOW   APPearance HAZY (A) CLEAR   Specific Gravity, Urine 1.023 1.005 - 1.030   pH 6.0 5.0 - 8.0   Glucose, UA NEGATIVE NEGATIVE mg/dL   Hgb urine dipstick NEGATIVE NEGATIVE   Bilirubin Urine NEGATIVE NEGATIVE   Ketones, ur 5 (A) NEGATIVE mg/dL   Protein, ur 30 (A) NEGATIVE mg/dL   Nitrite NEGATIVE NEGATIVE   Leukocytes, UA NEGATIVE NEGATIVE   RBC / HPF 0-5 0 - 5 RBC/hpf   WBC, UA 0-5 0 - 5 WBC/hpf   Bacteria, UA RARE (A) NONE SEEN   Squamous Epithelial / LPF 0-5 (A) NONE SEEN   Mucus PRESENT     Imaging:  US Ob Limited  Result Date: 02/04/2017 ----------------------------------------------------------------------  OBSTETRICS REPORT                      (Signed Final 02/04/2017 05:37 pm) ---------------------------------------------------------------------- Patient Info  ID #:       [id number]                          D.O.B.:  [birth year] (36 yrs)  Name:       Amber Haas-                Visit Date: 02/04/2017 05:17 pm              OLOSUN ---------------------------------------------------------------------- Performed By  Performed By:     Lewayne Bunting         Ref. Address:     [address]                    [address]                                                             [phone]  [place]                                                             [address]  Attending:        Nettie Elm MD       [facility]         [facility]                                                             [facility]  Referred By:      Roe Coombs CNM  ---------------------------------------------------------------------- Orders   #  Description                                 Code   1  US OB LIMITED                               [id number]  ----------------------------------------------------------------------   #  Ordered By               Order #        Accession #    Episode #   1  Nettie Elm            [id number]      [id number]     [id number]  ---------------------------------------------------------------------- Indications   [redacted] weeks gestation of pregnancy                Z3A.37   Pre-existing essential hypertension            O10.013   complicating pregnancy, third trimester  ---------------------------------------------------------------------- OB History  Blood Type:            Height:  5'2"   Weight (lb):  172       BMI:  31.46  Gravidity:    7         Term:   5        Prem:   0        SAB:   1  TOP:          0       Ectopic:  0  Living: 5 ---------------------------------------------------------------------- Fetal Evaluation  Num Of Fetuses:     1  Fetal Heart         154  Rate(bpm):  Cardiac Activity:   Observed  Presentation:       Vertex  Amniotic Fluid  AFI FV:      Subjectively within normal limits  AFI Sum(cm)     %Tile       Largest Pocket(cm)  17.91           69          8.26  RUQ(cm)       RLQ(cm)       LUQ(cm)        LLQ(cm)  8.26          2.69          2.37           4.59 ---------------------------------------------------------------------- Gestational Age  LMP:           37w 3d        Date:  05/18/16                 EDD:   02/22/17  Best:          37w 3d     Det. By:  LMP  (05/18/16)          EDD:   02/22/17 ---------------------------------------------------------------------- Comments  Normal amniotic fluid volume. ---------------------------------------------------------------------- Impression  Normal AFI  Vertex presentation ---------------------------------------------------------------------- Recommendations  Continue with  antenatal testing as indicated ----------------------------------------------------------------------                  Nettie Elm, MD Electronically Signed Final Report   02/04/2017 05:37 pm ----------------------------------------------------------------------  [place] Mfm Fetal Bpp Wo Non Stress  Result Date: 02/12/2017 ----------------------------------------------------------------------  OBSTETRICS REPORT                      (Signed Final 02/12/2017 03:22 pm) ---------------------------------------------------------------------- Patient Info  ID #:       [id number]                          D.O.B.:  [birth year] (36 yrs)  Name:       Amber Haas-                Visit Date: 02/12/2017 01:02 pm              OLOSUN ---------------------------------------------------------------------- Performed By  Performed By:     Earley Brooke     Ref. Address:     [address]                                                             [address]                                                             [place]  [address]  Attending:        Durwin Nora       Location:         [facility]                    MD  Referred By:      Roe Coombs CNM ---------------------------------------------------------------------- Orders   #  Description                                 Code   1  [place] MFM FETAL BPP WO NON STRESS              76819.01   2  [place] MFM OB FOLLOW UP                         60454.09  ----------------------------------------------------------------------   #  Ordered By               Order #        Accession #    Episode #   1  Nettie Elm            [id number]      [id number]     [id number]   2  MICHAEL ERVIN            [id number]      [id number]     [id number]  ---------------------------------------------------------------------- Indications   [redacted] weeks gestation of pregnancy                 Z3A.38   Pre-existing essential hypertension            O10.013   complicating pregnancy, third trimester  ---------------------------------------------------------------------- OB History  Blood Type:            Height:  5'2"   Weight (lb):  172       BMI:  31.46  Gravidity:    7         Term:   5        Prem:   0        SAB:   1  TOP:          0       Ectopic:  0        Living: 5 ---------------------------------------------------------------------- Fetal Evaluation  Num Of Fetuses:     1  Fetal Heart         154  Rate(bpm):  Cardiac Activity:   Observed  Presentation:       Cephalic  Placenta:           Anterior, above cervical os  P. Cord Insertion:  Previously Visualized  Amniotic Fluid  AFI FV:      Subjectively within normal limits  AFI Sum(cm)     %Tile       Largest Pocket(cm)  17.35           69          9.51  RUQ(cm)       RLQ(cm)       LUQ(cm)        LLQ(cm)  9.51          3.63          1.99  2.22 ---------------------------------------------------------------------- Biophysical Evaluation  Amniotic F.V:   Within normal limits       F. Tone:        Observed  F. Movement:    Observed                   Score:          8/8  F. Breathing:   Observed ---------------------------------------------------------------------- Biometry  BPD:      89.3  mm     G. Age:  36w 1d         16  %    CI:        78.02   %    70 - 86                                                          FL/HC:      23.5   %    20.6 - 23.4  HC:      319.9  mm     G. Age:  36w 1d        < 3  %    HC/AC:      0.94        0.87 - 1.06  AC:      339.9  mm     G. Age:  37w 6d         51  %    FL/BPD:     84.1   %    71 - 87  FL:       75.1  mm     G. Age:  38w 3d         51  %    FL/AC:      22.1   %    20 - 24  HUM:      62.6  mm     G. Age:  36w 2d         33  %  Est. FW:    3251  gm      7 lb 3 oz     62  % ---------------------------------------------------------------------- Gestational Age  LMP:           38w 4d        Date:   05/18/16                 EDD:   02/22/17  U/S Today:     37w 1d                                        EDD:   03/04/17  Best:          38w 4d     Det. By:  LMP  (05/18/16)          EDD:   02/22/17 ---------------------------------------------------------------------- Anatomy  Cranium:               Appears normal         Aortic Arch:            Previously seen  Cavum:                 Previously seen  Ductal Arch:            Previously seen  Ventricles:            Previously seen        Diaphragm:              Appears normal  Choroid Plexus:        Previously seen        Stomach:                Appears normal, left                                                                        sided  Cerebellum:            Previously seen        Abdomen:                Appears normal  Posterior Fossa:       Previously seen        Abdominal Wall:         Previously seen  Nuchal Fold:           Not applicable (>20    Cord Vessels:           Previously seen                         wks GA)  Face:                  Orbits and profile     Kidneys:                Appear normal                         previously seen  Lips:                  Previously seen        Bladder:                Appears normal  Thoracic:              Appears normal         Spine:                  Previously seen  Heart:                 Appears normal         Upper Extremities:      Previously seen                         (4CH, axis, and                         situs)  RVOT:                  Previously seen        Lower Extremities:      Previously seen  LVOT:                  Previously seen  Other:  Female gender previously seen. Heels previously visualized. Technically          difficult due to fetal position. ---------------------------------------------------------------------- Cervix Uterus Adnexa  Cervix  Not visualized (advanced GA >29wks) ---------------------------------------------------------------------- Impression  SIUP at [redacted]w[redacted]d, HTN  active  fetus  EFW 62nd%'le  no defects seen  AFI is normal  BPP 8/8 ---------------------------------------------------------------------- Recommendations  Given CHTN, continue antenatal testing until delivery at 38-  39 weeks. ----------------------------------------------------------------------               Durwin Nora, MD Electronically Signed Final Report   02/12/2017 03:22 pm ----------------------------------------------------------------------  [place] Mfm Ob Follow Up  Result Date: 02/12/2017 ----------------------------------------------------------------------  OBSTETRICS REPORT                      (Signed Final 02/12/2017 03:22 pm) ---------------------------------------------------------------------- Patient Info  ID #:       [id number]                          D.O.B.:  1980-06-07 (36 yrs)  Name:       Amber Haas-                Visit Date: 02/12/2017 01:02 pm              OLOSUN ---------------------------------------------------------------------- Performed By  Performed By:     Earley Brooke     Ref. Address:     [address]                                                             [address]                                                             [place]                                                             [address]  Attending:        Durwin Nora       Location:         [facility]                    MD  Referred By:      Roe Coombs CNM ---------------------------------------------------------------------- Orders   #  Description                                 Code   1  [place] MFM FETAL BPP WO NON STRESS  [address].60   2  [place] MFM OB FOLLOW UP                         45409.81  ----------------------------------------------------------------------   #  Ordered By               Order #        Accession #    Episode #   1  Nettie Elm            [id number]      [id number]     [id number]   2  MICHAEL ERVIN             [id number]      [id number]     [id number]  ---------------------------------------------------------------------- Indications   [redacted] weeks gestation of pregnancy                Z3A.38   Pre-existing essential hypertension            O10.013   complicating pregnancy, third trimester  ---------------------------------------------------------------------- OB History  Blood Type:            Height:  5'2"   Weight (lb):  172       BMI:  31.46  Gravidity:    7         Term:   5        Prem:   0        SAB:   1  TOP:          0       Ectopic:  0        Living: 5 ---------------------------------------------------------------------- Fetal Evaluation  Num Of Fetuses:     1  Fetal Heart         154  Rate(bpm):  Cardiac Activity:   Observed  Presentation:       Cephalic  Placenta:           Anterior, above cervical os  P. Cord Insertion:  Previously Visualized  Amniotic Fluid  AFI FV:      Subjectively within normal limits  AFI Sum(cm)     %Tile       Largest Pocket(cm)  17.35           69          9.51  RUQ(cm)       RLQ(cm)       LUQ(cm)        LLQ(cm)  9.51          3.63          1.99           2.22 ---------------------------------------------------------------------- Biophysical Evaluation  Amniotic F.V:   Within normal limits       F. Tone:        Observed  F. Movement:    Observed                   Score:          8/8  F. Breathing:   Observed ---------------------------------------------------------------------- Biometry  BPD:      89.3  mm     G. Age:  36w 1d         16  %    CI:        78.02   %    70 - 86  FL/HC:      23.5   %    20.6 - 23.4  HC:      319.9  mm     G. Age:  36w 1d        < 3  %    HC/AC:      0.94        0.87 - 1.06  AC:      339.9  mm     G. Age:  37w 6d         51  %    FL/BPD:     84.1   %    71 - 87  FL:       75.1  mm     G. Age:  38w 3d         51  %    FL/AC:      22.1   %    20 - 24  HUM:      62.6  mm     G. Age:  36w 2d          33  %  Est. FW:    3251  gm      7 lb 3 oz     62  % ---------------------------------------------------------------------- Gestational Age  LMP:           38w 4d        Date:  05/18/16                 EDD:   02/22/17  U/S Today:     37w 1d                                        EDD:   03/04/17  Best:          38w 4d     Det. By:  LMP  (05/18/16)          EDD:   02/22/17 ---------------------------------------------------------------------- Anatomy  Cranium:               Appears normal         Aortic Arch:            Previously seen  Cavum:                 Previously seen        Ductal Arch:            Previously seen  Ventricles:            Previously seen        Diaphragm:              Appears normal  Choroid Plexus:        Previously seen        Stomach:                Appears normal, left                                                                        sided  Cerebellum:  Previously seen        Abdomen:                Appears normal  Posterior Fossa:       Previously seen        Abdominal Wall:         Previously seen  Nuchal Fold:           Not applicable (>20    Cord Vessels:           Previously seen                         wks GA)  Face:                  Orbits and profile     Kidneys:                Appear normal                         previously seen  Lips:                  Previously seen        Bladder:                Appears normal  Thoracic:              Appears normal         Spine:                  Previously seen  Heart:                 Appears normal         Upper Extremities:      Previously seen                         (4CH, axis, and                         situs)  RVOT:                  Previously seen        Lower Extremities:      Previously seen  LVOT:                  Previously seen  Other:  Female gender previously seen. Heels previously visualized. Technically          difficult due to fetal position. ----------------------------------------------------------------------  Cervix Uterus Adnexa  Cervix  Not visualized (advanced GA >29wks) ---------------------------------------------------------------------- Impression  SIUP at [redacted]w[redacted]d, HTN  active fetus  EFW 62nd%'le  no defects seen  AFI is normal  BPP 8/8 ---------------------------------------------------------------------- Recommendations  Given CHTN, continue antenatal testing until delivery at 38-  39 weeks. ----------------------------------------------------------------------               Durwin Nora, MD Electronically Signed Final Report   02/12/2017 03:22 pm ----------------------------------------------------------------------   MAU Course: Vitals and nursing notes reviewed  I personally reviewed the patient's NST today, found to be REACTIVE. 130 bpm, mod var, +accels, no decels. CTX: 1-2 min.   MDM: Plan of care reviewed with patient, including labs and tests ordered and medical treatment.   Assessment: Labor  Plan: Report called to L&D provider for admission   Caryl Ada, DO [facility], [facility] 02/18/2017 9:44 PM

## 2017-02-18 NOTE — H&P (Signed)
Amber Haas is a 36 y.o. [id number] female at 3455w3d by LMP c/w 5wk u/s, presenting w/ uc's and cervical change.  UC's began about 2 days ago, worsened today.  Reports active fetal movement, contractions: regular, every 2 minutes, vaginal bleeding: none, membranes: intact. [facility] [facility] [place] [facility].   Most recent u/s 02/12/17, EFW 62%/7lb3oz/3251g, normal AFI, vtx.   This pregnancy complicated by: Late care @ 22wks CHTN- no meds Travel to [place] at start of pregnancy Rubella non-imm  Prenatal History/Complications:  Term SVB x 5, 1 SAB  Past Medical History: Past Medical History:  Diagnosis Date  . Anemia   . Corneal dystrophy   . Headache   . History of recurrent UTIs   . Hypertension    never on meds  . Malaria    2016, treated in [place]  . Sciatica associated with disorder of lumbar spine     Past Surgical History: Past Surgical History:  Procedure Laterality Date  . axillary disection Left    years ago    Obstetrical History: OB History    Gravida Para Term Preterm AB Living   7 5 5   1 5    SAB TAB Ectopic Multiple Live Births   1     0 5      Obstetric Comments   With last delivery patient states she did have excessive bleeding- not transfusion necessary.      Social History: Social History   Socioeconomic History  . Marital status: Married    Spouse name: Risk analystBaBatonck  . Number of children: 5  . Years of education: 6616  . Highest education level: None  Social Needs  . Financial resource strain: None  . Food insecurity - worry: None  . Food insecurity - inability: None  . Transportation needs - medical: None  . Transportation needs - non-medical: None  Occupational History    Comment: home maker  Tobacco Use  . Smoking status: Never Smoker  . Smokeless tobacco: Never Used  Substance and Sexual Activity  . Alcohol use: No    Comment: occasional  . Drug use: No  . Sexual activity: Yes    Birth control/protection: None   Other Topics Concern  . None  Social History Narrative   Lives at home with husband, children    caffeine- not every day    Family History: Family History  Problem Relation Age of Onset  . Hypertension Father   . Diabetes Maternal Grandmother   . Heart disease Paternal Grandmother     Allergies: No Known Allergies  Medications Prior to Admission  Medication Sig Dispense Refill Last Dose  . acetaminophen (TYLENOL) 500 MG tablet Take 1,000 mg by mouth every 6 (six) hours as needed for moderate pain.    02/18/2017 at Unknown time  . ibuprofen (ADVIL,MOTRIN) 400 MG tablet Take 400 mg by mouth every 6 (six) hours as needed for moderate pain.    Past Week at Unknown time  . Prenat-FeAsp-Meth-FA-DHA w/o A (PRENATE PIXIE) 10-0.6-0.4-200 MG CAPS Take 1 tablet by mouth daily. 30 capsule 12 02/18/2017 at Unknown time  . aspirin 81 MG chewable tablet Chew 1 tablet (81 mg total) by mouth daily. (Patient not taking: Reported on 02/04/2017) 30 tablet 12 More than a month at Unknown time    Review of Systems  Pertinent pos/neg as indicated in HPI  Blood pressure 137/81, pulse 94, temperature 98.1 F (36.7 C), resp. rate 16, height 5\' 2"  (1.575 m), weight 181 lb (  82.1 kg), last menstrual period 05/18/2016, not currently breastfeeding. General appearance: alert, cooperative and no distress Lungs: clear to auscultation bilaterally Heart: regular rate and rhythm Abdomen: gravid, soft, non-tender Extremities: no edema DTR's 2+  Fetal monitoring: FHR: 135 bpm, variability: moderate,  Accelerations: Present,  decelerations:  Absent Uterine activity: regular q 1-743mins Dilation: 5 Effacement (%): 70 Station: -3 Exam by:: Dr. Doroteo GlassmanPhelps Presentation: cephalic   Prenatal labs: ABO, Rh: B/Positive/-- (08/07 1648) Antibody: Negative (08/07 1648) Rubella: <0.90 (08/07 1648) RPR: Non Reactive (09/27 1035)  HBsAg: Negative (08/07 1648)  HIV:   neg GBS: Negative (11/20 1606)   2hr GTT:  84/147/144 Genetic screening:  Neg MaterniT21 Anatomy US: normal female  Results for orders placed or performed during the hospital encounter of 02/18/17 (from the past 24 hour(s))  Urinalysis, Routine w reflex microscopic   Collection Time: 02/18/17  8:30 PM  Result Value Ref Range   Color, Urine YELLOW YELLOW   APPearance HAZY (A) CLEAR   Specific Gravity, Urine 1.023 1.005 - 1.030   pH 6.0 5.0 - 8.0   Glucose, UA NEGATIVE NEGATIVE mg/dL   Hgb urine dipstick NEGATIVE NEGATIVE   Bilirubin Urine NEGATIVE NEGATIVE   Ketones, ur 5 (A) NEGATIVE mg/dL   Protein, ur 30 (A) NEGATIVE mg/dL   Nitrite NEGATIVE NEGATIVE   Leukocytes, UA NEGATIVE NEGATIVE   RBC / HPF 0-5 0 - 5 RBC/hpf   WBC, UA 0-5 0 - 5 WBC/hpf   Bacteria, UA RARE (A) NONE SEEN   Squamous Epithelial / LPF 0-5 (A) NONE SEEN   Mucus PRESENT      Assessment:  7521w3d SIUP  [id number]  Active labor  Cat 1 FHR  CHTN- no meds, normotensive  GBS Negative (11/20 1606)  Plan:  Admit to BS  IV pain meds/epidural prn active labor  Expectant management  Anticipate NSVB   Plans to breast/pumpfeed  Contraception: in-hospital BTL, consent 12/12/16  Circumcision: outpatient  [name], Amber Haas CNM, WHNP-BC 02/18/2017, 11:41 PM

## 2017-02-19 ENCOUNTER — Other Ambulatory Visit: Payer: Self-pay

## 2017-02-19 ENCOUNTER — Inpatient Hospital Stay (HOSPITAL_COMMUNITY): Payer: Medicaid Other | Admitting: Anesthesiology

## 2017-02-19 ENCOUNTER — Encounter (HOSPITAL_COMMUNITY)
Admission: AD | Disposition: A | Discharge status: Home / Self Care | Payer: Self-pay | Source: Ambulatory Visit | Attending: Family Medicine

## 2017-02-19 ENCOUNTER — Encounter: Payer: Medicaid Other | Admitting: Obstetrics and Gynecology

## 2017-02-19 ENCOUNTER — Inpatient Hospital Stay (HOSPITAL_COMMUNITY): Payer: Medicaid Other | Admitting: Certified Registered Nurse Anesthetist

## 2017-02-19 ENCOUNTER — Encounter (HOSPITAL_COMMUNITY): Payer: Self-pay

## 2017-02-19 ENCOUNTER — Encounter (HOSPITAL_COMMUNITY): Payer: Self-pay | Admitting: *Deleted

## 2017-02-19 ENCOUNTER — Ambulatory Visit (HOSPITAL_COMMUNITY): Payer: Medicaid Other

## 2017-02-19 DIAGNOSIS — Z3A39 39 weeks gestation of pregnancy: Secondary | ICD-10-CM

## 2017-02-19 DIAGNOSIS — O1092 Unspecified pre-existing hypertension complicating childbirth: Secondary | ICD-10-CM

## 2017-02-19 DIAGNOSIS — Z302 Encounter for sterilization: Secondary | ICD-10-CM

## 2017-02-19 HISTORY — PX: TUBAL LIGATION: SHX77

## 2017-02-19 LAB — CBC
HCT: 31 % — ABNORMAL LOW (ref 36.0–46.0)
HEMATOCRIT: 32.2 % — AB (ref 36.0–46.0)
Hemoglobin: 10.1 g/dL — ABNORMAL LOW (ref 12.0–15.0)
Hemoglobin: 10.3 g/dL — ABNORMAL LOW (ref 12.0–15.0)
MCH: 27 pg (ref 26.0–34.0)
MCH: 27.2 pg (ref 26.0–34.0)
MCHC: 32 g/dL (ref 30.0–36.0)
MCHC: 32.6 g/dL (ref 30.0–36.0)
MCV: 83.6 fL (ref 78.0–100.0)
MCV: 84.5 fL (ref 78.0–100.0)
PLATELETS: 238 10*3/uL (ref 150–400)
Platelets: 296 10*3/uL (ref 150–400)
RBC: 3.71 MIL/uL — ABNORMAL LOW (ref 3.87–5.11)
RBC: 3.81 MIL/uL — AB (ref 3.87–5.11)
RDW: 14.9 % (ref 11.5–15.5)
RDW: 15.2 % (ref 11.5–15.5)
WBC: 13.5 10*3/uL — AB (ref 4.0–10.5)
WBC: 7.3 10*3/uL (ref 4.0–10.5)

## 2017-02-19 LAB — TYPE AND SCREEN
ABO/RH(D): B POS
Antibody Screen: NEGATIVE

## 2017-02-19 LAB — RPR: RPR: NONREACTIVE

## 2017-02-19 SURGERY — LIGATION, FALLOPIAN TUBE, POSTPARTUM
Anesthesia: Epidural | Site: Abdomen | Laterality: Bilateral | Wound class: Clean Contaminated

## 2017-02-19 MED ORDER — PRENATAL MULTIVITAMIN CH
1.0000 | ORAL_TABLET | Freq: Every day | ORAL | Status: DC
  Administered 2017-02-20: 1 via ORAL
  Filled 2017-02-19: qty 1

## 2017-02-19 MED ORDER — ACETAMINOPHEN 325 MG PO TABS: 650.0000 mg | ORAL_TABLET | ORAL | Status: DC | PRN

## 2017-02-19 MED ORDER — DIBUCAINE 1 % RE OINT: 1.0000 "application " | TOPICAL_OINTMENT | RECTAL | Status: DC | PRN

## 2017-02-19 MED ORDER — PHENYLEPHRINE HCL 10 MG/ML IJ SOLN
INTRAMUSCULAR | Status: DC | PRN
  Administered 2017-02-19 (×2): 80 ug via INTRAVENOUS

## 2017-02-19 MED ORDER — PHENYLEPHRINE 40 MCG/ML (10ML) SYRINGE FOR IV PUSH (FOR BLOOD PRESSURE SUPPORT): 80.0000 ug | PREFILLED_SYRINGE | INTRAVENOUS | Status: DC | PRN

## 2017-02-19 MED ORDER — BUPIVACAINE HCL (PF) 0.25 % IJ SOLN
INTRAMUSCULAR | Status: AC
  Filled 2017-02-19: qty 30

## 2017-02-19 MED ORDER — DEXAMETHASONE SODIUM PHOSPHATE 10 MG/ML IJ SOLN
INTRAMUSCULAR | Status: DC | PRN
  Administered 2017-02-19: 10 mg via INTRAVENOUS

## 2017-02-19 MED ORDER — IBUPROFEN 600 MG PO TABS
600.0000 mg | ORAL_TABLET | Freq: Four times a day (QID) | ORAL | Status: DC
  Administered 2017-02-20 (×3): 600 mg via ORAL
  Filled 2017-02-19 (×4): qty 1

## 2017-02-19 MED ORDER — KETOROLAC TROMETHAMINE 30 MG/ML IJ SOLN: 30.0000 mg | Freq: Four times a day (QID) | INTRAMUSCULAR | Status: AC | PRN

## 2017-02-19 MED ORDER — OXYCODONE-ACETAMINOPHEN 5-325 MG PO TABS: 2.0000 | ORAL_TABLET | ORAL | Status: DC | PRN

## 2017-02-19 MED ORDER — ONDANSETRON HCL 4 MG/2ML IJ SOLN: 4.0000 mg | Freq: Three times a day (TID) | INTRAMUSCULAR | Status: DC | PRN

## 2017-02-19 MED ORDER — SODIUM BICARBONATE 8.4 % IV SOLN
INTRAVENOUS | Status: AC
  Filled 2017-02-19: qty 50

## 2017-02-19 MED ORDER — DOCUSATE SODIUM 100 MG PO CAPS
100.0000 mg | ORAL_CAPSULE | Freq: Two times a day (BID) | ORAL | Status: DC
  Administered 2017-02-19 – 2017-02-20 (×2): 100 mg via ORAL
  Filled 2017-02-19 (×2): qty 1

## 2017-02-19 MED ORDER — EPHEDRINE 5 MG/ML INJ: 10.0000 mg | INTRAVENOUS | Status: DC | PRN

## 2017-02-19 MED ORDER — DEXAMETHASONE SODIUM PHOSPHATE 10 MG/ML IJ SOLN
INTRAMUSCULAR | Status: AC
  Filled 2017-02-19: qty 1

## 2017-02-19 MED ORDER — SIMETHICONE 80 MG PO CHEW
80.0000 mg | CHEWABLE_TABLET | ORAL | Status: DC | PRN
  Administered 2017-02-20: 80 mg via ORAL
  Filled 2017-02-19: qty 1

## 2017-02-19 MED ORDER — FENTANYL CITRATE (PF) 100 MCG/2ML IJ SOLN: 25.0000 ug | INTRAMUSCULAR | Status: DC | PRN

## 2017-02-19 MED ORDER — SCOPOLAMINE 1 MG/3DAYS TD PT72
1.0000 | MEDICATED_PATCH | Freq: Once | TRANSDERMAL | Status: DC
  Administered 2017-02-19: 1.5 mg via TRANSDERMAL

## 2017-02-19 MED ORDER — OXYTOCIN 40 UNITS IN LACTATED RINGERS INFUSION - SIMPLE MED
1.0000 m[IU]/min | INTRAVENOUS | Status: DC
  Administered 2017-02-19: 2 m[IU]/min via INTRAVENOUS

## 2017-02-19 MED ORDER — MEPERIDINE HCL 25 MG/ML IJ SOLN: 6.2500 mg | INTRAMUSCULAR | Status: DC | PRN

## 2017-02-19 MED ORDER — NALBUPHINE HCL 10 MG/ML IJ SOLN: 5.0000 mg | INTRAMUSCULAR | Status: DC | PRN

## 2017-02-19 MED ORDER — NALBUPHINE HCL 10 MG/ML IJ SOLN: 5.0000 mg | Freq: Once | INTRAMUSCULAR | Status: DC | PRN

## 2017-02-19 MED ORDER — PHENYLEPHRINE 40 MCG/ML (10ML) SYRINGE FOR IV PUSH (FOR BLOOD PRESSURE SUPPORT)
PREFILLED_SYRINGE | INTRAVENOUS | Status: AC
  Filled 2017-02-19: qty 10

## 2017-02-19 MED ORDER — DIPHENHYDRAMINE HCL 25 MG PO CAPS
25.0000 mg | ORAL_CAPSULE | ORAL | Status: DC | PRN
  Filled 2017-02-19: qty 1

## 2017-02-19 MED ORDER — BENZOCAINE-MENTHOL 20-0.5 % EX AERO: 1.0000 "application " | INHALATION_SPRAY | CUTANEOUS | Status: DC | PRN

## 2017-02-19 MED ORDER — COCONUT OIL OIL: 1.0000 "application " | TOPICAL_OIL | Status: DC | PRN

## 2017-02-19 MED ORDER — SCOPOLAMINE 1 MG/3DAYS TD PT72
MEDICATED_PATCH | TRANSDERMAL | Status: AC
  Filled 2017-02-19: qty 1

## 2017-02-19 MED ORDER — ONDANSETRON HCL 4 MG PO TABS: 4.0000 mg | ORAL_TABLET | ORAL | Status: DC | PRN

## 2017-02-19 MED ORDER — LIDOCAINE HCL (PF) 1 % IJ SOLN
INTRAMUSCULAR | Status: DC | PRN
  Administered 2017-02-19 (×2): 5 mL via EPIDURAL

## 2017-02-19 MED ORDER — TERBUTALINE SULFATE 1 MG/ML IJ SOLN: 0.2500 mg | Freq: Once | INTRAMUSCULAR | Status: DC | PRN

## 2017-02-19 MED ORDER — ONDANSETRON HCL 4 MG/2ML IJ SOLN: 4.0000 mg | INTRAMUSCULAR | Status: DC | PRN

## 2017-02-19 MED ORDER — BUPIVACAINE HCL (PF) 0.25 % IJ SOLN
INTRAMUSCULAR | Status: DC | PRN
  Administered 2017-02-19: 10 mL

## 2017-02-19 MED ORDER — DIPHENHYDRAMINE HCL 50 MG/ML IJ SOLN: 12.5000 mg | INTRAMUSCULAR | Status: DC | PRN

## 2017-02-19 MED ORDER — ONDANSETRON HCL 4 MG/2ML IJ SOLN
INTRAMUSCULAR | Status: AC
  Filled 2017-02-19: qty 2

## 2017-02-19 MED ORDER — SODIUM BICARBONATE 8.4 % IV SOLN
INTRAVENOUS | Status: DC | PRN
  Administered 2017-02-19 (×4): 5 mL via EPIDURAL

## 2017-02-19 MED ORDER — OXYCODONE-ACETAMINOPHEN 5-325 MG PO TABS: 1.0000 | ORAL_TABLET | ORAL | Status: DC | PRN

## 2017-02-19 MED ORDER — ONDANSETRON HCL 4 MG/2ML IJ SOLN
INTRAMUSCULAR | Status: DC | PRN
  Administered 2017-02-19: 4 mg via INTRAVENOUS

## 2017-02-19 MED ORDER — WITCH HAZEL-GLYCERIN EX PADS: 1.0000 "application " | MEDICATED_PAD | CUTANEOUS | Status: DC | PRN

## 2017-02-19 MED ORDER — FENTANYL 2.5 MCG/ML BUPIVACAINE 1/10 % EPIDURAL INFUSION (WH - ANES)
14.0000 mL/h | INTRAMUSCULAR | Status: DC | PRN
  Administered 2017-02-19 (×2): 14 mL/h via EPIDURAL
  Filled 2017-02-19: qty 100

## 2017-02-19 MED ORDER — NALOXONE HCL 0.4 MG/ML IJ SOLN: 1.0000 ug/kg/h | INTRAVENOUS | Status: DC | PRN

## 2017-02-19 MED ORDER — LIDOCAINE-EPINEPHRINE (PF) 2 %-1:200000 IJ SOLN
INTRAMUSCULAR | Status: AC
  Filled 2017-02-19: qty 20

## 2017-02-19 MED ORDER — DIPHENHYDRAMINE HCL 25 MG PO CAPS: 25.0000 mg | ORAL_CAPSULE | Freq: Four times a day (QID) | ORAL | Status: DC | PRN

## 2017-02-19 MED ORDER — LACTATED RINGERS IV SOLN
500.0000 mL | Freq: Once | INTRAVENOUS | Status: AC
  Administered 2017-02-19: 500 mL via INTRAVENOUS

## 2017-02-19 MED ORDER — SODIUM CHLORIDE 0.9 % IR SOLN
Status: DC | PRN
  Administered 2017-02-19: 1

## 2017-02-19 MED ORDER — NALOXONE HCL 0.4 MG/ML IJ SOLN: 0.4000 mg | INTRAMUSCULAR | Status: DC | PRN

## 2017-02-19 MED ORDER — SODIUM CHLORIDE 0.9% FLUSH: 3.0000 mL | INTRAVENOUS | Status: DC | PRN

## 2017-02-19 MED ORDER — PHENYLEPHRINE 40 MCG/ML (10ML) SYRINGE FOR IV PUSH (FOR BLOOD PRESSURE SUPPORT)
PREFILLED_SYRINGE | INTRAVENOUS | Status: AC
Start: 2017-02-19 — End: 2017-02-19
  Filled 2017-02-19: qty 20

## 2017-02-19 MED ORDER — FENTANYL 2.5 MCG/ML BUPIVACAINE 1/10 % EPIDURAL INFUSION (WH - ANES)
INTRAMUSCULAR | Status: AC
  Filled 2017-02-19: qty 100

## 2017-02-19 MED ORDER — METOCLOPRAMIDE HCL 5 MG/ML IJ SOLN: 10.0000 mg | Freq: Once | INTRAMUSCULAR | Status: DC

## 2017-02-19 SURGICAL SUPPLY — 22 items
BLADE SURG 11 STRL SS (BLADE) ×3 IMPLANT
CLIP FILSHIE TUBAL LIGA STRL (Clip) ×3 IMPLANT
CLOTH BEACON ORANGE TIMEOUT ST (SAFETY) ×3 IMPLANT
DRSG OPSITE POSTOP 3X4 (GAUZE/BANDAGES/DRESSINGS) ×3 IMPLANT
DURAPREP 26ML APPLICATOR (WOUND CARE) ×3 IMPLANT
GLOVE BIOGEL PI IND STRL 7.0 (GLOVE) ×1 IMPLANT
GLOVE BIOGEL PI IND STRL 7.5 (GLOVE) ×1 IMPLANT
GLOVE BIOGEL PI INDICATOR 7.0 (GLOVE) ×2
GLOVE BIOGEL PI INDICATOR 7.5 (GLOVE) ×2
GLOVE ECLIPSE 7.5 STRL STRAW (GLOVE) ×3 IMPLANT
GOWN STRL REUS W/TWL LRG LVL3 (GOWN DISPOSABLE) ×6 IMPLANT
NEEDLE HYPO 22GX1.5 SAFETY (NEEDLE) ×3 IMPLANT
NS IRRIG 1000ML POUR BTL (IV SOLUTION) ×3 IMPLANT
PACK ABDOMINAL MINOR (CUSTOM PROCEDURE TRAY) ×3 IMPLANT
PROTECTOR NERVE ULNAR (MISCELLANEOUS) ×3 IMPLANT
SPONGE LAP 4X18 X RAY DECT (DISPOSABLE) IMPLANT
SUT VICRYL 0 UR6 27IN ABS (SUTURE) ×3 IMPLANT
SUT VICRYL 4-0 PS2 18IN ABS (SUTURE) ×3 IMPLANT
SYR CONTROL 10ML LL (SYRINGE) ×3 IMPLANT
TOWEL OR 17X24 6PK STRL BLUE (TOWEL DISPOSABLE) ×6 IMPLANT
TRAY FOLEY CATH SILVER 14FR (SET/KITS/TRAYS/PACK) ×3 IMPLANT
WATER STERILE IRR 1000ML POUR (IV SOLUTION) ×3 IMPLANT

## 2017-02-19 NOTE — Op Note (Signed)
Amber Haas 02/18/2017 - 02/19/2017  PREOPERATIVE DIAGNOSIS:  Multiparity, undesired fertility  POSTOPERATIVE DIAGNOSIS:  Multiparity, undesired fertility  PROCEDURE:  Postpartum Bilateral Tubal Sterilization using Filshie Clips   ANESTHESIA:  Epidural  COMPLICATIONS:  None immediate.  ESTIMATED BLOOD LOSS:  Less than 20 ml.  FLUIDS: 1400 ml LR.  URINE OUTPUT:  800 ml of clear urine.  INDICATIONS: 36 y.o. [id number]  with undesired fertility,status post vaginal delivery, desires permanent sterilization. Risks and benefits of procedure discussed with patient including permanence of method, bleeding, infection, injury to surrounding organs and need for additional procedures. Risk failure of 0.5-1% with increased risk of ectopic gestation if pregnancy occurs was also discussed with patient.   FINDINGS:  Normal uterus, tubes, and ovaries.  TECHNIQUE:  The patient was taken to the operating room where her epidural anesthesia was dosed up to surgical level and found to be adequate.  She was then placed in the dorsal supine position and prepped and draped in sterile fashion.  After an adequate timeout was performed, attention was turned to the patient's abdomen where a small transverse skin incision was made under the umbilical fold. The incision was taken down to the layer of fascia using the scalpel, and fascia was incised, and extended bilaterally using Mayo scissors. The peritoneum was entered in a sharp fashion. Attention was then turned to the patient's uterus, and left fallopian tube was identified and followed out to the fimbriated end.  A Filshie clip was placed on the left fallopian tube about 2 cm from the cornual attachment, with care given to incorporate the underlying mesosalpinx.  A similar process was carried out on the rightl side allowing for bilateral tubal sterilization.  Good hemostasis was noted overall.  Local analgesia was drizzled on both operative sites.The instruments  were then removed from the patient's abdomen and the fascial incision was repaired with 0 Vicryl, and the skin was closed with a 3-0 Monocryl subcuticular stitch. The patient tolerated the procedure well.  Sponge, lap, and needle counts were correct times two.  The patient was then taken to the recovery room awake, extubated and in stable condition.

## 2017-02-19 NOTE — Anesthesia Procedure Notes (Signed)
Epidural Patient location during procedure: OB Start time: 02/19/2017 2:00 AM End time: 02/19/2017 2:10 AM  Staffing Anesthesiologist: Bethena Midgetddono, Milka Windholz, MD  Preanesthetic Checklist Completed: patient identified, site marked, surgical consent, pre-op evaluation, timeout performed, IV checked, risks and benefits discussed and monitors and equipment checked  Epidural Patient position: sitting Prep: site prepped and draped and DuraPrep Patient monitoring: continuous pulse ox and blood pressure Approach: midline Location: L4-L5 Injection technique: LOR air  Needle:  Needle type: Tuohy  Needle gauge: 17 G Needle length: 9 cm and 9 Needle insertion depth: 5 cm cm Catheter type: closed end flexible Catheter size: 19 Gauge Catheter at skin depth: 10 cm Test dose: negative  Assessment Events: blood not aspirated, injection not painful, no injection resistance, negative IV test and no paresthesia

## 2017-02-19 NOTE — Anesthesia Postprocedure Evaluation (Signed)
Anesthesia Post Note  Patient: Marine scientistTiara Haas  Procedure(s) Performed: POST PARTUM TUBAL LIGATION (Bilateral Abdomen)     Patient location during evaluation: PACU Anesthesia Type: Epidural Level of consciousness: awake Pain management: pain level controlled Vital Signs Assessment: post-procedure vital signs reviewed and stable Respiratory status: spontaneous breathing Cardiovascular status: stable Postop Assessment: no headache, no backache, epidural receding, no apparent nausea or vomiting and patient able to bend at knees Anesthetic complications: no    Last Vitals:  Vitals:   02/19/17 1415 02/19/17 1430  BP: 99/61 105/70  Pulse: 86 89  Resp: 13 15  Temp:    SpO2: 100% 100%    Last Pain:  Vitals:   02/19/17 1345  TempSrc:   PainSc: Asleep   Pain Goal: Patients Stated Pain Goal: 4 (02/19/17 0156)               Danayah Smyre JR,JOHN Susann GivensFRANKLIN

## 2017-02-19 NOTE — Anesthesia Preprocedure Evaluation (Signed)
Anesthesia Evaluation  Patient identified by MRN, date of birth, ID band Patient awake    Reviewed: Allergy & Precautions, NPO status , Patient's Chart, lab work & pertinent test results  Airway Mallampati: II  TM Distance: >3 FB     Dental   Pulmonary neg pulmonary ROS,    breath sounds clear to auscultation       Cardiovascular hypertension,  Rhythm:Regular Rate:Normal     Neuro/Psych    GI/Hepatic Neg liver ROS, History noted. CG   Endo/Other    Renal/GU negative Renal ROS     Musculoskeletal   Abdominal   Peds  Hematology   Anesthesia Other Findings   Reproductive/Obstetrics                             Anesthesia Physical Anesthesia Plan  ASA: III  Anesthesia Plan: Epidural   Post-op Pain Management:  Regional for Post-op pain   Induction: Intravenous  PONV Risk Score and Plan: 2 and Treatment may vary due to age or medical condition  Airway Management Planned: Simple Face Mask  Additional Equipment:   Intra-op Plan:   Post-operative Plan:   Informed Consent: I have reviewed the patients History and Physical, chart, labs and discussed the procedure including the risks, benefits and alternatives for the proposed anesthesia with the patient or authorized representative who has indicated his/her understanding and acceptance.   Dental advisory given  Plan Discussed with: CRNA and Anesthesiologist  Anesthesia Plan Comments:         Anesthesia Quick Evaluation

## 2017-02-19 NOTE — Transfer of Care (Signed)
Immediate Anesthesia Transfer of Care Note  Patient: Amber Haas  Procedure(s) Performed: POST PARTUM TUBAL LIGATION (Bilateral Abdomen)  Patient Location: PACU  Anesthesia Type:Epidural  Level of Consciousness: awake  Airway & Oxygen Therapy: Patient Spontanous Breathing  Post-op Assessment: Report given to RN  Post vital signs: Reviewed and stable  Last Vitals:  Vitals:   02/19/17 1146 02/19/17 1300  BP: 111/71   Pulse: 88   Resp:    Temp:  36.6 C  SpO2:      Last Pain:  Vitals:   02/19/17 1345  TempSrc:   PainSc: Asleep      Patients Stated Pain Goal: 4 (02/19/17 0156)  Complications: No apparent anesthesia complications

## 2017-02-19 NOTE — Progress Notes (Signed)
Patient ID: Amber Haas, female   DOB: 01/02/1981, 36 y.o.   MRN: [id number] Amber Haas is a 36 y.o. [id number] at 5085w4d admitted for active labor  Subjective: Comfortable w/ epidural  Objective: BP 128/86   Pulse (!) 101   Temp 98.8 F (37.1 C) (Oral)   Resp 16   Ht 5\' 2"  (1.575 m)   Wt 82.1 kg (181 lb)   LMP 05/18/2016 (Approximate)   SpO2 100%   BMI 33.11 kg/m  No intake/output data recorded.  FHT:  FHR: 130 bpm, variability: moderate,  accelerations:  Present,  decelerations:  Absent UC:   regular, every 1-4 minutes  SVE:   Dilation: 5 Effacement (%): 70 Station: -2 Exam by:: K Ajanae Virag CNM   BBOW  AROM mod-large amt clear fluid   Labs: Lab Results  Component Value Date   WBC 7.3 02/18/2017   HGB 10.3 (L) 02/18/2017   HCT 32.2 (L) 02/18/2017   MCV 84.5 02/18/2017   PLT 296 02/18/2017    Assessment / Plan: Spontaneous labor, progressing normally, now arom'd  Labor: Progressing normally Fetal Wellbeing:  Category I Pain Control:  Epidural Pre-eclampsia: n/a I/D:  n/a Anticipated MOD:  NSVD  Amber Haas, Amber Haas CNM, WHNP-BC 02/19/2017, 3:34 AM

## 2017-02-19 NOTE — Progress Notes (Signed)
Patient ID: Amber Haas, female   DOB: 10/14/1980, 36 y.o.   MRN: [id number] Amber Haas is a 36 y.o. [id number] at 2262w4d admitted for active labor  Subjective: Comfortable w/ epidural  Objective: BP 113/70   Pulse (!) 102   Temp 98 F (36.7 C) (Oral)   Resp 18   Ht 5\' 2"  (1.575 m)   Wt 82.1 kg (181 lb)   LMP 05/18/2016 (Approximate)   SpO2 100%   BMI 33.11 kg/m  No intake/output data recorded.  FHT:  FHR: 135 bpm, variability: moderate,  accelerations:  Present,  decelerations:  Absent UC:   regular, every 2-4 minutes  SVE:   Dilation: 6 Effacement (%): 50 Station: -3 Exam by:: E Davis RN  Pitocin @ 4 mu/min  Labs: Lab Results  Component Value Date   WBC 7.3 02/18/2017   HGB 10.3 (L) 02/18/2017   HCT 32.2 (L) 02/18/2017   MCV 84.5 02/18/2017   PLT 296 02/18/2017    Assessment / Plan: Augmentation of labor, progressing well, on pitocin  Labor: Progressing normally Fetal Wellbeing:  Category I Pain Control:  Epidural Pre-eclampsia: n/a I/D:  n/a Anticipated MOD:  NSVD  [name], Kimberly Randall CNM, WHNP-BC 02/19/2017, 7:55 AM

## 2017-02-19 NOTE — Anesthesia Preprocedure Evaluation (Signed)
Anesthesia Evaluation  Patient identified by MRN, date of birth, ID band Patient awake    Reviewed: Allergy & Precautions, H&P , NPO status , Patient's Chart, lab work & pertinent test results, reviewed documented beta blocker date and time   Airway Mallampati: II  TM Distance: >3 FB Neck ROM: full    Dental no notable dental hx.    Pulmonary neg pulmonary ROS,    Pulmonary exam normal breath sounds clear to auscultation       Cardiovascular hypertension, negative cardio ROS Normal cardiovascular exam Rhythm:regular Rate:Normal     Neuro/Psych negative neurological ROS  negative psych ROS   GI/Hepatic negative GI ROS, Neg liver ROS,   Endo/Other  negative endocrine ROS  Renal/GU negative Renal ROS  negative genitourinary   Musculoskeletal   Abdominal   Peds  Hematology negative hematology ROS (+)   Anesthesia Other Findings   Reproductive/Obstetrics (+) Pregnancy                             Anesthesia Physical Anesthesia Plan  ASA: II  Anesthesia Plan: Epidural   Post-op Pain Management:    Induction:   PONV Risk Score and Plan:   Airway Management Planned:   Additional Equipment:   Intra-op Plan:   Post-operative Plan:   Informed Consent: I have reviewed the patients History and Physical, chart, labs and discussed the procedure including the risks, benefits and alternatives for the proposed anesthesia with the patient or authorized representative who has indicated his/her understanding and acceptance.       Plan Discussed with:   Anesthesia Plan Comments:         Anesthesia Quick Evaluation  

## 2017-02-19 NOTE — Anesthesia Pain Management Evaluation Note (Signed)
  [facility] Pain Management Visit Note  Patient: Amber Haas, 36 y.o., female  "Hello I am a member of the anesthesia team at [facility]. We have an anesthesia team available at all times to provide care throughout the hospital, including epidural management and anesthesia for C-section. I don't know your plan for the delivery whether it a natural birth, water birth, IV sedation, nitrous supplementation, doula or epidural, but we want to meet your pain goals."   1.Was your pain managed to your expectations on prior hospitalizations?   Yes   2.What is your expectation for pain management during this hospitalization?     Epidural  3.How can we help you reach that goal? unsure  Record the patient's initial score and the patient's pain goal.   Pain: 0  Pain Goal: 7 [facility] wants you to be able to say your pain was always managed very well.  Cephus ShellingBURGER,Anjel Perfetti 02/19/2017

## 2017-02-20 MED ORDER — IBUPROFEN 600 MG PO TABS: 600.0000 mg | ORAL_TABLET | Freq: Four times a day (QID) | ORAL | 0 refills | Status: DC | PRN

## 2017-02-20 MED ORDER — OXYCODONE-ACETAMINOPHEN 5-325 MG PO TABS: 1.0000 | ORAL_TABLET | ORAL | 0 refills | Status: DC | PRN

## 2017-02-20 MED ORDER — MEASLES, MUMPS & RUBELLA VAC ~~LOC~~ INJ
0.5000 mL | INJECTION | Freq: Once | SUBCUTANEOUS | Status: DC
  Filled 2017-02-20: qty 0.5

## 2017-02-20 NOTE — Anesthesia Postprocedure Evaluation (Signed)
Anesthesia Post Note  Patient: Marine scientistTiara Haas  Procedure(s) Performed: AN AD HOC LABOR EPIDURAL     Patient location during evaluation: Mother Baby Anesthesia Type: Epidural Level of consciousness: awake Pain management: satisfactory to patient Vital Signs Assessment: post-procedure vital signs reviewed and stable Respiratory status: spontaneous breathing Cardiovascular status: stable Anesthetic complications: no    Last Vitals:  Vitals:   02/20/17 0053 02/20/17 0500  BP: 121/74 109/65  Pulse: 88 82  Resp: 18 18  Temp: 36.9 C 36.7 C  SpO2:      Last Pain:  Vitals:   02/20/17 0726  TempSrc:   PainSc: 0-No pain   Pain Goal: Patients Stated Pain Goal: 4 (02/19/17 0156)               Cephus ShellingBURGER,Ravon Mcilhenny

## 2017-02-20 NOTE — Addendum Note (Signed)
Addendum  created 02/20/17 0817 by Algis GreenhouseBurger, Antwian Santaana A, CRNA   Charge Capture section accepted, Sign clinical note

## 2017-02-20 NOTE — Discharge Instructions (Signed)
Laparoscopic Tubal Ligation, Care After Refer to this sheet in the next few weeks. These instructions provide you with information about caring for yourself after your procedure. Your health care provider may also give you more specific instructions. Your treatment has been planned according to current medical practices, but problems sometimes occur. Call your health care provider if you have any problems or questions after your procedure. What can I expect after the procedure? After the procedure, it is common to have:  A sore throat.  Discomfort in your shoulder.  Mild discomfort or cramping in your abdomen.  Gas pains.  Pain or soreness in the area where the surgical cut (incision) was made.  A bloated feeling.  Tiredness.  Nausea.  Vomiting.  Follow these instructions at home: Medicines  Take over-the-counter and prescription medicines only as told by your health care provider.  Do not take aspirin because it can cause bleeding.  Do not drive or operate heavy machinery while taking prescription pain medicine. Activity  Rest for the rest of the day.  Return to your normal activities as told by your health care provider. Ask your health care provider what activities are safe for you. Incision care   Follow instructions from your health care provider about how to take care of your incision. Make sure you: ? Wash your hands with soap and water before you change your bandage (dressing). If soap and water are not available, use hand sanitizer. ? Change your dressing as told by your health care provider. ? Leave stitches (sutures) in place. They may need to stay in place for 2 weeks or longer.  Check your incision area every day for signs of infection. Check for: ? More redness, swelling, or pain. ? More fluid or blood. ? Warmth. ? Pus or a bad smell. Other Instructions  Do not take baths, swim, or use a hot tub until your health care provider approves. You may take  showers.  Keep all follow-up visits as told by your health care provider. This is important.  Have someone help you with your daily household tasks for the first few days. Contact a health care provider if:  You have more redness, swelling, or pain around your incision.  Your incision feels warm to the touch.  You have pus or a bad smell coming from your incision.  The edges of your incision break open after the sutures have been removed.  Your pain does not improve after 2-3 days.  You have a rash.  You repeatedly become dizzy or light-headed.  Your pain medicine is not helping.  You are constipated. Get help right away if:  You have a fever.  You faint.  You have increasing pain in your abdomen.  You have severe pain in one or both of your shoulders.  You have fluid or blood coming from your sutures or from your vagina.  You have shortness of breath or difficulty breathing.  You have chest pain or leg pain.  You have ongoing nausea, vomiting, or diarrhea. This information is not intended to replace advice given to you by your health care provider. Make sure you discuss any questions you have with your health care provider. Document Released: 09/21/2004 Document Revised: 08/07/2015 Document Reviewed: 02/12/2015 Elsevier Interactive Patient Education  2018 Elsevier Inc. Vaginal Delivery, Care After Refer to this sheet in the next few weeks. These instructions provide you with information about caring for yourself after vaginal delivery. Your health care provider may also give you more  specific instructions. Your treatment has been planned according to current medical practices, but problems sometimes occur. Call your health care provider if you have any problems or questions. What can I expect after the procedure? After vaginal delivery, it is common to have:  Some bleeding from your vagina.  Soreness in your abdomen, your vagina, and the area of skin between your  vaginal opening and your anus (perineum).  Pelvic cramps.  Fatigue.  Follow these instructions at home: Medicines  Take over-the-counter and prescription medicines only as told by your health care provider.  If you were prescribed an antibiotic medicine, take it as told by your health care provider. Do not stop taking the antibiotic until it is finished. Driving   Do not drive or operate heavy machinery while taking prescription pain medicine.  Do not drive for 24 hours if you received a sedative. Lifestyle  Do not drink alcohol. This is especially important if you are breastfeeding or taking medicine to relieve pain.  Do not use tobacco products, including cigarettes, chewing tobacco, or e-cigarettes. If you need help quitting, ask your health care provider. Eating and drinking  Drink at least 8 eight-ounce glasses of water every day unless you are told not to by your health care provider. If you choose to breastfeed your baby, you may need to drink more water than this.  Eat high-fiber foods every day. These foods may help prevent or relieve constipation. High-fiber foods include: ? Whole grain cereals and breads. ? Brown rice. ? Beans. ? Fresh fruits and vegetables. Activity  Return to your normal activities as told by your health care provider. Ask your health care provider what activities are safe for you.  Rest as much as possible. Try to rest or take a nap when your baby is sleeping.  Do not lift anything that is heavier than your baby or 10 lb (4.5 kg) until your health care provider says that it is safe.  Talk with your health care provider about when you can engage in sexual activity. This may depend on your: ? Risk of infection. ? Rate of healing. ? Comfort and desire to engage in sexual activity. Vaginal Care  If you have an episiotomy or a vaginal tear, check the area every day for signs of infection. Check for: ? More redness, swelling, or pain. ? More  fluid or blood. ? Warmth. ? Pus or a bad smell.  Do not use tampons or douches until your health care provider says this is safe.  Watch for any blood clots that may pass from your vagina. These may look like clumps of dark red, brown, or black discharge. General instructions  Keep your perineum clean and dry as told by your health care provider.  Wear loose, comfortable clothing.  Wipe from front to back when you use the toilet.  Ask your health care provider if you can shower or take a bath. If you had an episiotomy or a perineal tear during labor and delivery, your health care provider may tell you not to take baths for a certain length of time.  Wear a bra that supports your breasts and fits you well.  If possible, have someone help you with household activities and help care for your baby for at least a few days after you leave the hospital.  Keep all follow-up visits for you and your baby as told by your health care provider. This is important. Contact a health care provider if:  You have: ?  Vaginal discharge that has a bad smell. ? Difficulty urinating. ? Pain when urinating. ? A sudden increase or decrease in the frequency of your bowel movements. ? More redness, swelling, or pain around your episiotomy or vaginal tear. ? More fluid or blood coming from your episiotomy or vaginal tear. ? Pus or a bad smell coming from your episiotomy or vaginal tear. ? A fever. ? A rash. ? Little or no interest in activities you used to enjoy. ? Questions about caring for yourself or your baby.  Your episiotomy or vaginal tear feels warm to the touch.  Your episiotomy or vaginal tear is separating or does not appear to be healing.  Your breasts are painful, hard, or turn red.  You feel unusually sad or worried.  You feel nauseous or you vomit.  You pass large blood clots from your vagina. If you pass a blood clot from your vagina, save it to show to your health care provider. Do  not flush blood clots down the toilet without having your health care provider look at them.  You urinate more than usual.  You are dizzy or light-headed.  You have not breastfed at all and you have not had a menstrual period for 12 weeks after delivery.  You have stopped breastfeeding and you have not had a menstrual period for 12 weeks after you stopped breastfeeding. Get help right away if:  You have: ? Pain that does not go away or does not get better with medicine. ? Chest pain. ? Difficulty breathing. ? Blurred vision or spots in your vision. ? Thoughts about hurting yourself or your baby.  You develop pain in your abdomen or in one of your legs.  You develop a severe headache.  You faint.  You bleed from your vagina so much that you fill two sanitary pads in one hour. This information is not intended to replace advice given to you by your health care provider. Make sure you discuss any questions you have with your health care provider. Document Released: 03/01/2000 Document Revised: 08/16/2015 Document Reviewed: 03/19/2015 Elsevier Interactive Patient Education  2017 [employer]

## 2017-02-20 NOTE — Discharge Summary (Signed)
OB Discharge Summary     Patient Name: Amber Haas DOB: [birth year] MRN: [id number]  Date of admission: 02/18/2017 Delivering MD: Lauretta Chester NILES   Date of discharge: 02/20/2017  Admitting diagnosis: 39 weeks, back pain and headache Intrauterine pregnancy: [redacted]w[redacted]d    Secondary diagnosis:  Active Problems:   SVD (spontaneous vaginal delivery)  Additional problems: cHTN (no meds); rubella nonimmune; undesired fertility     Discharge diagnosis: Term Pregnancy Delivered and CHTN                                                                                                Post partum procedures:postpartum tubal ligation (MMR to be offered prior to d/c)  Augmentation: AROM and Pitocin  Complications: None  Hospital course:  Onset of Labor With Vaginal Delivery     36y.o. yo [id number] admitted in Latent Labor on 02/18/2017. Patient had an uncomplicated labor course as follows:  Membrane Rupture Time/Date: 3:25 AM ,02/19/2017   Intrapartum Procedures: Episiotomy: None [1]                                         Lacerations:  None [1]  Patient had a delivery of a Viable infant. 02/19/2017  Information for the patient's newborn:  OlMakell, Drohan0[[id number]]Delivery Method: Vaginal, Spontaneous(Filed from Delivery Summary)    Pateint had an uncomplicated postpartum course.  She is ambulating, tolerating a regular diet, passing flatus, and urinating well. She has been normotensive during her PP stay. Patient is discharged home per her request in stable condition on 02/20/17.   Physical exam  Vitals:   02/19/17 1610 02/19/17 2008 02/20/17 0053 02/20/17 0500  BP: 112/72 114/60 121/74 109/65  Pulse: 77 92 88 82  Resp: 18 18 18 18   Temp: 98.2 F (36.8 C) 98.5 F (36.9 C) 98.5 F (36.9 C) 98.1 F (36.7 C)  TempSrc: Oral Oral    SpO2: 100% 100%    Weight:      Height:       General: alert and cooperative Lochia: appropriate Uterine Fundus:  firm Incision: tubal inc clean and dry DVT Evaluation: No evidence of DVT seen on physical exam. Labs: Lab Results  Component Value Date   WBC 13.5 (H) 02/19/2017   HGB 10.1 (L) 02/19/2017   HCT 31.0 (L) 02/19/2017   MCV 83.6 02/19/2017   PLT 238 02/19/2017   CMP Latest Ref Rng & Units 11/06/2016  Glucose 65 - 99 mg/dL -  BUN 6 - 20 mg/dL -  Creatinine 0.57 - 1.00 mg/dL 0.56(L)  Sodium 134 - 144 mmol/L -  Potassium 3.5 - 5.2 mmol/L -  Chloride 96 - 106 mmol/L -  CO2 20 - 29 mmol/L -  Calcium 8.7 - 10.2 mg/dL -  Total Protein 6.0 - 8.5 g/dL -  Total Bilirubin 0.0 - 1.2 mg/dL -  Alkaline Phos 39 - 117 IU/L -  AST 0 - 40 IU/L -  ALT 0 - 32  IU/L -    Discharge instruction: per After Visit Summary and "Baby and Me Booklet".  After visit meds:  Allergies as of 02/20/2017   No Known Allergies     Medication List    STOP taking these medications   acetaminophen 500 MG tablet Commonly known as:  TYLENOL   aspirin 81 MG chewable tablet     TAKE these medications   ibuprofen 600 MG tablet Commonly known as:  ADVIL,MOTRIN Take 1 tablet (600 mg total) by mouth every 6 (six) hours as needed. What changed:    medication strength  how much to take  reasons to take this   oxyCODONE-acetaminophen 5-325 MG tablet Commonly known as:  PERCOCET/ROXICET Take 1 tablet by mouth every 4 (four) hours as needed (pain scale 4-7).   PRENATE PIXIE 10-0.6-0.4-200 MG Caps Take 1 tablet by mouth daily.       Diet: routine diet  Activity: Advance as tolerated. Pelvic rest for 6 weeks.   Outpatient follow up:4 weeks (Baby Love BP check in 1-2 weeks) Follow up Appt:No future appointments. Follow up Visit:No Follow-up on file.  Postpartum contraception: Tubal Ligation  Newborn Data: Live born female  Birth Weight: 6 lb 12.8 oz (3084 g) APGAR: 9, 9  Newborn Delivery   Birth date/time:  02/19/2017 10:01:00 Delivery type:  Vaginal, Spontaneous     Baby Feeding:  Breast Disposition:home with mother   02/20/2017 Serita Grammes, CNM 7:19 AM

## 2017-02-20 NOTE — Lactation Note (Signed)
This note was copied from a baby's chart. Lactation Consultation Note  Patient Name: Amber Haas ZOXWR'UToday's Date: 02/20/2017 Reason for consult: Initial assessment;Term Breastfeeding consultation services and support information given and reviewed.  This is mom's 6th baby and she BF previous babies without difficulty.  She reports newborn is feeding well.  Instructed to feed with any feeding cue and call for assist prn.  Reviewed engorgement treatment.  Outpatient support encouraged prn.  Maternal Data Does the patient have breastfeeding experience prior to this delivery?: Yes  Feeding Feeding Type: Breast Fed Length of feed: 10 min  LATCH Score                   Interventions    Lactation Tools Discussed/Used     Consult Status Consult Status: PRN    Huston FoleyMOULDEN, Tekeyah Santiago S 02/20/2017, 9:26 AM

## 2017-02-20 NOTE — Anesthesia Postprocedure Evaluation (Signed)
Anesthesia Post Note  Patient: Amber Haas  Procedure(s) Performed: AN AD HOC LABOR EPIDURAL     Anesthesia Post Evaluation  Last Vitals:  Vitals:   02/20/17 0053 02/20/17 0500  BP: 121/74 109/65  Pulse: 88 82  Resp: 18 18  Temp: 36.9 C 36.7 C  SpO2:      Last Pain:  Vitals:   02/20/17 0726  TempSrc:   PainSc: 0-No pain   Pain Goal: Patients Stated Pain Goal: 4 (02/19/17 0156)               Jagdeep Ancheta

## 2017-02-22 ENCOUNTER — Inpatient Hospital Stay (HOSPITAL_COMMUNITY): Payer: Medicaid Other

## 2017-03-20 ENCOUNTER — Ambulatory Visit: Payer: Medicaid Other | Admitting: Obstetrics and Gynecology

## 2017-03-27 ENCOUNTER — Encounter (HOSPITAL_COMMUNITY): Payer: Self-pay | Admitting: Family Medicine

## 2017-06-16 ENCOUNTER — Emergency Department (HOSPITAL_COMMUNITY): Payer: Medicaid Other

## 2017-06-16 ENCOUNTER — Emergency Department (HOSPITAL_BASED_OUTPATIENT_CLINIC_OR_DEPARTMENT_OTHER): Admit: 2017-06-16 | Discharge: 2017-06-16 | Disposition: A | Discharge status: Home / Self Care | Payer: Medicaid Other

## 2017-06-16 ENCOUNTER — Emergency Department (HOSPITAL_COMMUNITY)
Admission: EM | Admit: 2017-06-16 | Discharge: 2017-06-17 | Disposition: A | Discharge status: Home / Self Care | Payer: Medicaid Other | Attending: Emergency Medicine | Admitting: Emergency Medicine

## 2017-06-16 ENCOUNTER — Other Ambulatory Visit: Payer: Self-pay

## 2017-06-16 ENCOUNTER — Encounter (HOSPITAL_COMMUNITY): Payer: Self-pay | Admitting: *Deleted

## 2017-06-16 DIAGNOSIS — M7989 Other specified soft tissue disorders: Secondary | ICD-10-CM | POA: Diagnosis not present

## 2017-06-16 DIAGNOSIS — M79662 Pain in left lower leg: Secondary | ICD-10-CM | POA: Diagnosis not present

## 2017-06-16 DIAGNOSIS — M79609 Pain in unspecified limb: Secondary | ICD-10-CM

## 2017-06-16 DIAGNOSIS — M549 Dorsalgia, unspecified: Secondary | ICD-10-CM | POA: Insufficient documentation

## 2017-06-16 DIAGNOSIS — I10 Essential (primary) hypertension: Secondary | ICD-10-CM | POA: Insufficient documentation

## 2017-06-16 DIAGNOSIS — R0602 Shortness of breath: Secondary | ICD-10-CM | POA: Diagnosis present

## 2017-06-16 LAB — CBC
HCT: 41.5 % (ref 36.0–46.0)
HEMOGLOBIN: 13.8 g/dL (ref 12.0–15.0)
MCH: 29.6 pg (ref 26.0–34.0)
MCHC: 33.3 g/dL (ref 30.0–36.0)
MCV: 88.9 fL (ref 78.0–100.0)
Platelets: 292 10*3/uL (ref 150–400)
RBC: 4.67 MIL/uL (ref 3.87–5.11)
RDW: 15.3 % (ref 11.5–15.5)
WBC: 3.9 10*3/uL — AB (ref 4.0–10.5)

## 2017-06-16 LAB — I-STAT TROPONIN, ED: TROPONIN I, POC: 0 ng/mL (ref 0.00–0.08)

## 2017-06-16 LAB — BASIC METABOLIC PANEL
ANION GAP: 9 (ref 5–15)
BUN: 8 mg/dL (ref 6–20)
CALCIUM: 9.4 mg/dL (ref 8.9–10.3)
CO2: 22 mmol/L (ref 22–32)
Chloride: 107 mmol/L (ref 101–111)
Creatinine, Ser: 0.79 mg/dL (ref 0.44–1.00)
GFR calc non Af Amer: >60 mL/min (ref >60)
Glucose, Bld: 92 mg/dL (ref 65–99)
Potassium: 4 mmol/L (ref 3.5–5.1)
Sodium: 138 mmol/L (ref 135–145)

## 2017-06-16 LAB — D-DIMER, QUANTITATIVE (NOT AT ARMC)

## 2017-06-16 LAB — I-STAT BETA HCG BLOOD, ED (MC, WL, AP ONLY)

## 2017-06-16 NOTE — ED Notes (Signed)
Paged vascular  

## 2017-06-16 NOTE — ED Provider Notes (Signed)
Patient placed in Quick Look pathway, seen and evaluated   Chief Complaint: SOB, right leg pain  HPI:   Patient presenting to the ED with several weeks of shortness of breath.  Patient states she has had increasing chest pain, mostly with inspiration.  She also reports intermittent dizziness.  States past few days she has had left leg pain/tightness over posterior calf and knee.  She states pain is worsening, and worse with ambulation.  She states she is 4 months postpartum.  No history of DVT or PE, however states she is pretty sedentary at home as she works from home.  Is not on OCPs.   ROS: + CP, + SOB, + leg pain (one)  Physical Exam:   Gen: No distress  Neuro: Awake and Alert  Skin: Warm    Focused Exam: heart sounds normal. Lungs CTAB. Left calf and posterior knee with with tenderness, no appreciable edema or erythema. Intact distal pulses.  Venous U/S ordered.  Initiation of care has begun. The patient has been counseled on the process, plan, and necessity for staying for the completion/evaluation, and the remainder of the medical screening examination.    Shanikqua Zarzycki, [place] N, PA-C 06/16/17 1900    Dione BoozeGlick, David, MD 06/16/17 2324

## 2017-06-16 NOTE — Progress Notes (Signed)
Left lower extremity venous completed. There is no evidence of DVT, superficial thrombosis, or Baker's cyst,. Gray[place] ElectricVirginia Jamarkis Haas, RVS  06/16/2017 8:38 PM

## 2017-06-16 NOTE — ED Notes (Signed)
Pt asked about the possibility of us running a D-dimer.  After speaking with Jeff-PA, he indicated that it was not necessary at this time but the EDP's could order one later if needed.  I explained Jeff's thoughts to the pt.  Pt understood.

## 2017-06-16 NOTE — ED Provider Notes (Signed)
[facility] EMERGENCY DEPARTMENT Provider Note   CSN: [id number] Arrival date & time: 06/16/17  1810     History   Chief Complaint Chief Complaint  Patient presents with  . Chest Pain    4 mo post-partum  . Leg Pain    HPI Amber Haas is a 37 y.o. female.  The history is provided by the patient.  She is 4 months postpartum and has history of hypertension and sciatica and comes in with about a one-month history of some intermittent difficulty breathing and lightheadedness.  There is associated tight feeling in the left upper back which is sometimes worse with deep breathing.  She denies fever or chills and denies cough.  There has been no nausea or vomiting.  She has noted some intermittent numbness of her left first toe.  She is also complaining of some vague left calf pain and tightness over the last week.  She has tried a variety of measures including massage and chiropractic which give slight, temporary relief.  Symptoms have been getting worse over the last month.  She is breast-feeding and is not on any nutritional supplements.  She is gravida 7, para 6 with 1 miscarriage.  Past Medical History:  Diagnosis Date  . Anemia   . Corneal dystrophy   . Headache   . History of recurrent UTIs   . Hypertension    never on meds  . Malaria    2016, treated in [place]  . Sciatica associated with disorder of lumbar spine     Patient Active Problem List   Diagnosis Date Noted  . SVD (spontaneous vaginal delivery) 02/19/2017  . Unwanted fertility 12/30/2016  . Rubella non-immune status, antepartum 10/29/2016  . Late prenatal care 10/22/2016  . Recent history of foreign travel 06/21/2016  . Supervision of high risk pregnancy, antepartum 06/19/2016  . Chronic hypertension during pregnancy, antepartum     Past Surgical History:  Procedure Laterality Date  . axillary disection Left    years ago  . TUBAL LIGATION Bilateral 02/19/2017   Procedure: POST  PARTUM TUBAL LIGATION;  Surgeon: Federico Flake, MD;  Location: [facility] BIRTHING SUITES;  Service: Gynecology;  Laterality: Bilateral;     OB History    Gravida  7   Para  6   Term  6   Preterm      AB  1   Living  6     SAB  1   TAB      Ectopic      Multiple  0   Live Births  6        Obstetric Comments  With last delivery patient states she did have excessive bleeding- not transfusion necessary.         Home Medications    Prior to Admission medications   Medication Sig Start Date End Date Taking? Authorizing Provider  ibuprofen (ADVIL,MOTRIN) 600 MG tablet Take 1 tablet (600 mg total) by mouth every 6 (six) hours as needed. 02/20/17   Arabella Merles, CNM  oxyCODONE-acetaminophen (PERCOCET/ROXICET) 5-325 MG tablet Take 1 tablet by mouth every 4 (four) hours as needed (pain scale 4-7). 02/20/17   Arabella Merles, CNM  Prenat-FeAsp-Meth-FA-DHA w/o A (PRENATE PIXIE) 10-0.6-0.4-200 MG CAPS Take 1 tablet by mouth daily. 10/22/16   Roe Coombs, CNM    Family History Family History  Problem Relation Age of Onset  . Hypertension Father   . Diabetes Maternal Grandmother   . Heart disease Paternal  Grandmother     Social History Social History   Tobacco Use  . Smoking status: Never Smoker  . Smokeless tobacco: Never Used  Substance Use Topics  . Alcohol use: No    Comment: occasional  . Drug use: No     Allergies   Patient has no known allergies.   Review of Systems Review of Systems  All other systems reviewed and are negative.    Physical Exam Updated Vital Signs BP 139/90   Pulse 72   Temp 97.9 F (36.6 C) (Oral)   Resp 16   Ht 5\' 2"  (1.575 m)   Wt 70.3 kg (155 lb)   LMP 06/15/2017   SpO2 100%   BMI 28.35 kg/m   Physical Exam  Nursing note and vitals reviewed.  37 year old female, resting comfortably and in no acute distress. Vital signs are normal. Oxygen saturation is 100%, which is normal. Head is normocephalic and  atraumatic. PERRLA, EOMI. Oropharynx is clear. Neck is nontender and supple without adenopathy or JVD. Back has mild tenderness in the left scapular area.  There is no midline tenderness.  Straight leg raise is negative.  There is no CVA tenderness. Lungs are clear without rales, wheezes, or rhonchi. Chest is nontender. Heart has regular rate and rhythm without murmur. Abdomen is soft, flat, nontender without masses or hepatosplenomegaly and peristalsis is normoactive. Extremities have no cyanosis or edema, full range of motion is present.  Calf circumference is symmetric.  There is no calf tenderness. Skin is warm and dry without rash. Neurologic: Mental status is normal, cranial nerves are intact, there are no motor or sensory deficits.  ED Treatments / Results  Labs (all labs ordered are listed, but only abnormal results are displayed) Labs Reviewed  CBC - Abnormal; Notable for the following components:      Result Value   WBC 3.9 (*)    All other components within normal limits  BASIC METABOLIC PANEL  I-STAT TROPONIN, ED  I-STAT BETA HCG BLOOD, ED ([facility], [facility], [facility] ONLY)    EKG EKG Interpretation  Date/Time:  Monday June 16 2017 18:24:07 EDT Ventricular Rate:  83 PR Interval:  162 QRS Duration: 64 QT Interval:  360 QTC Calculation: 423 R Axis:   51 Text Interpretation:  Normal sinus rhythm Nonspecific T wave abnormality Abnormal ECG When compared with ECG of 04/29/2016, No significant change was found Confirmed by Dione BoozeGlick, Irish Piech ([id number]) on 06/16/2017 10:56:09 PM   Radiology Dg Chest 2 View  Result Date: 06/16/2017 CLINICAL DATA:  Increasing chest pain and shortness of breath over the past few weeks. EXAM: CHEST - 2 VIEW COMPARISON:  Chest x-ray dated June 19, 2016. FINDINGS: The heart size and mediastinal contours are within normal limits. Both lungs are clear. The visualized skeletal structures are unremarkable. IMPRESSION: Normal chest x-ray. Electronically Signed   By: Obie DredgeWilliam T  Derry M.D.   On: 06/16/2017 19:07    Procedures Procedures  Medications Ordered in ED Medications - No data to display   Initial Impression / Assessment and Plan / ED Course  I have reviewed the triage vital signs and the nursing notes.  Pertinent labs & imaging results that were available during my care of the patient were reviewed by me and considered in my medical decision making (see chart for details).  Patient with multiple complaints but no worrisome findings on physical exam.  Old records are reviewed confirming term delivery in December 2018 with postpartum tubal ligation.  ED workup as  been reassuring.  She has normal CBC, basic metabolic panel, ECG, chest x-ray.  Venous Doppler had been ordered from triage and showed no evidence of DVT.  Will double check d-dimer, but likelihood of pulmonary embolism is very small with negative venous Dopplers.  We will also check urinalysis.  I have discussed with the patient that she may need nutritional supplements given her breast-feeding status.  Patient is very reluctant to take anything because she tries to stick with all natural, holistic approaches to her health.  12:18 AM Patient states that she cannot wait for test results, she has to get home because of babysitting issues.  At this point, I feel comfortable that serious pathology has been effectively ruled out.  She is advised to use over-the-counter analgesics as needed for pain, apply ice.  Encouraged her to take prenatal vitamins to ensure that she is getting adequate amounts of vitamins and minerals given her lactating state.  Final Clinical Impressions(s) / ED Diagnoses   Final diagnoses:  Upper back pain    ED Discharge Orders        Ordered    Prenatal Vit-Fe Fumarate-FA (PRENATAL COMPLETE) 14-0.4 MG TABS  Daily     06/17/17 0017       Dione Booze, MD 06/17/17 [phone]

## 2017-06-16 NOTE — ED Triage Notes (Signed)
Pt states increasing chest pain, especially on inspiration, dizziness and sob for several weeks.  Also c/o L calf tightness/soreness/numbness.  Pt is 4 months post-partum.

## 2017-06-16 NOTE — ED Notes (Signed)
Ultrasound complete. Patient in waiting area.

## 2017-06-17 LAB — URINALYSIS, ROUTINE W REFLEX MICROSCOPIC
Bilirubin Urine: NEGATIVE
Glucose, UA: NEGATIVE mg/dL
Hgb urine dipstick: NEGATIVE
Ketones, ur: NEGATIVE mg/dL
Leukocytes, UA: NEGATIVE
Nitrite: NEGATIVE
Protein, ur: NEGATIVE mg/dL
Specific Gravity, Urine: 1.01 (ref 1.005–1.030)
pH: 6 (ref 5.0–8.0)

## 2017-06-17 MED ORDER — PRENATAL COMPLETE 14-0.4 MG PO TABS: 1.0000 | ORAL_TABLET | Freq: Every day | ORAL | 0 refills | Status: DC

## 2017-06-17 NOTE — ED Notes (Signed)
PT states understanding of care given, follow up care, and medication prescribed. PT ambulated from ED to car with a steady gait. 

## 2017-06-17 NOTE — Discharge Instructions (Addendum)
Apply ice several times a day. IT is acceptable to take ibuprofen or naproxen or acetaminophen as needed for pain.  There is no sign of any blood clots.

## 2017-07-25 ENCOUNTER — Other Ambulatory Visit: Payer: Self-pay | Admitting: Student

## 2017-07-25 ENCOUNTER — Ambulatory Visit (INDEPENDENT_AMBULATORY_CARE_PROVIDER_SITE_OTHER): Payer: Medicaid Other | Admitting: Student

## 2017-07-25 ENCOUNTER — Telehealth: Payer: Self-pay

## 2017-07-25 ENCOUNTER — Encounter: Payer: Self-pay | Admitting: Student

## 2017-07-25 VITALS — BP 124/70 | HR 77 | Temp 98.6°F | Ht 62.0 in | Wt 155.0 lb

## 2017-07-25 DIAGNOSIS — R42 Dizziness and giddiness: Secondary | ICD-10-CM

## 2017-07-25 DIAGNOSIS — Z0130 Encounter for examination of blood pressure without abnormal findings: Secondary | ICD-10-CM

## 2017-07-25 DIAGNOSIS — M25462 Effusion, left knee: Secondary | ICD-10-CM | POA: Diagnosis not present

## 2017-07-25 DIAGNOSIS — M79602 Pain in left arm: Secondary | ICD-10-CM

## 2017-07-25 NOTE — Patient Instructions (Addendum)
It was great seeing you today! We have addressed the following issues today  Blood pressure: Your blood pressure is normal.  Your goal blood pressure is less than 130/80.  Check your blood pressure as we discussed in the office.  Write down the numbers and bring it to your next appointment.  Dizziness: We are checking your thyroid levels today.  I suggest you stop taking herbal supplements.   Swelling behind your left knee: We ordered an ultrasound.   Left arm and leg pain: recommend you take Tylenol extra strength is every 8 hours   If we did any lab work today, and the results require attention, either me or my nurse will get in touch with you. If everything is normal, you will get a letter in mail and a message via . If you don't hear from [place] in two weeks, please give [place] a call. Otherwise, we look forward to seeing you again at your next visit. If you have any questions or concerns before then, please call the clinic at [phone].  Please bring all your medications to every doctors visit  Sign up for My Chart to have easy access to your labs results, and communication with your Primary care physician.    Please check-out at the front desk before leaving the clinic.    Take Care,   Dr. Alanda Slim

## 2017-07-25 NOTE — Telephone Encounter (Signed)
Called patient and informed her that her ultrasound is 07/30/2017 at [facility] at 1600 hours with a show time of 1545. Patient understood and was appreciative.Glennie Hawk, CMA

## 2017-07-25 NOTE — Progress Notes (Signed)
Subjective:    Amber Haas is a 37 y.o. old female here to establish care. She has multiple complaints.   HPI Establish care: previously followed at [facility].She says she hasn't been there for about two years now.  High blood pressure: she says she has history of high blood pressure. She says her blood pressure is intermittently high.  She checks her blood pressure at home.  Checks her blood pressure. Blood pressure ranges from 136/90 and 154/100. Not on NSAID. Not on blood pressure medication. She denies palpitation, chest pain or shortness of breath.   Dizziness: she describes this as feeling off balance. This has been going on since she had a baby about 5 months ago. Denies heavy period. Denies blood in stool or dark stool. She takes m/vit occ. She takes wheat grass powder, maringa, seamoss. She is vegan. Just stopped breast feeding because her milk production dropped. Occasional cold sensation.   Left arm and leg pain: tightness and pulling. Tingling at times in left foot. Tried occasional ibuprofen, yoga and epson salt.  Denies fever, chills, bowel or bladder issue.   PMH/Problem List: has Chronic hypertension during pregnancy, antepartum; Recent history of foreign travel; and Rubella non-immune status, antepartum on their problem list.   has a past medical history of Anemia, Corneal dystrophy, Headache, History of recurrent UTIs, Hypertension, Malaria, and Sciatica associated with disorder of lumbar spine.  FH:  Family History  Problem Relation Age of Onset  . Hypertension Father   . Diabetes Maternal Grandmother   . Heart disease Paternal Grandmother     SH Social History   Tobacco Use  . Smoking status: Never Smoker  . Smokeless tobacco: Never Used  Substance Use Topics  . Alcohol use: No    Comment: occasional  . Drug use: No    Review of Systems Review of systems negative except for pertinent positives and negatives in history of present illness  above.     Objective:     Vitals:   07/25/17 1432  BP: 124/70  Pulse: 77  Temp: 98.6 F (37 C)  TempSrc: Oral  SpO2: 98%  Weight: 155 lb (70.3 kg)  Height:  (1.575 m)   Body mass index is 28.35 kg/m.  Physical Exam  GEN: appears well & comfortable. No apparent distress. Head: normocephalic and atraumatic  Eyes: conjunctiva without injection. Sclera anicteric. CVS: RRR, nl s1 & s2, no murmurs, no edema RESP: no IWOB, good air movement bilaterally, CTAB GI: BS present & normal, soft, NTND, no guarding, no rebound, no palpable mass MSK:  Left knee Normal to inspection with no erythema or effusion or obvious bony abnormalities. Palpable, mobile, circular lump about 1 inch in diameter in her left popliteal area laterally.  No overlying skin erythema.  No increased warmth to touch.  No changes in size with flexion or extension of her knee. ROM full in flexion and extension and lower leg rotation. Ligaments with solid consistent endpoints Non painful patellar compression. No significant McMurray sign  Normal sensation in right leg, foot.   Neurovascularly intact with good distal pulses. Left Shoulder: Inspection reveals no abnormalities, atrophy or asymmetry except for minor left scapular winging at rest but not with pushing on the wall Palpation is normal with no tenderness over AC joint or bicipital groove or trapezius muscles. ROM is full in all planes.  Rotator cuff strength normal but mild pain with external rotation. No signs of impingement with negative Neer and Hawkin's tests, empty  can sign. Speeds and Yergason's tests normal. No labral pathology noted with negative Obrien's, negative clunk and good stability. Normal scapular function observed. No painful arc and no drop arm sign. Neuro: C5-T1 intact CVS: brachialis, radial and ulnar pulses intact.   SKIN: no apparent skin lesion ENDO: negative thyromegally NEURO: alert and oiented appropriately, no gross  deficits. HINT exam normal. PSYCH: euthymic mood with congruent affect     Assessment and Plan:  1. Dizziness: unclear etiology. Neuro and HINT exam within normal limit. She is on multiple herbal supplement which might contribute. Recent blood work including CBC, BMP and UA not impressive. Advised against herbal supplements given insufficient information about risks. Will check TSH given "sudden" loss of milk production.   - TSH  2. Swelling of joint, knee, left: exam with palpable, mobile, circular lump about 1 inch in diameter in her left popliteal area laterally concerning for LAD vs. Baker cyst.  No overlying skin erythema.  No increased warmth to touch. No calf tenderness or swelling. Will obtain US (nonvascular).  3. Pain in left arm: inspection reveals no abnormalities, atrophy or asymmetry except for minor left scapular winging at rest but not with pushing on the wall. Otherwise, normal exam. Recommended Tylenol/Aleve as needed for pain. She is not breast feeding any more  4. Blood pressure check: within normal limit today. History of chronic hypertension in pregnancy. Life style including diet and exercise.   Return in about 2 weeks (around 08/08/2017) for Physical.  Almon Hercules, MD 07/27/17 Pager: [phone]

## 2017-07-26 LAB — TSH: TSH: 1.07 u[IU]/mL (ref 0.450–4.500)

## 2017-07-27 ENCOUNTER — Encounter: Payer: Self-pay | Admitting: Student

## 2017-07-27 DIAGNOSIS — R42 Dizziness and giddiness: Secondary | ICD-10-CM | POA: Insufficient documentation

## 2017-07-27 DIAGNOSIS — M79602 Pain in left arm: Secondary | ICD-10-CM | POA: Insufficient documentation

## 2017-07-27 DIAGNOSIS — Z0130 Encounter for examination of blood pressure without abnormal findings: Secondary | ICD-10-CM | POA: Insufficient documentation

## 2017-07-27 DIAGNOSIS — M25462 Effusion, left knee: Secondary | ICD-10-CM | POA: Insufficient documentation

## 2017-07-28 ENCOUNTER — Telehealth: Payer: Self-pay

## 2017-07-28 NOTE — Telephone Encounter (Signed)
Pt left message on nurse line requesting results of her TSH. Returned call- no answer. Left hipaa compliant message with negative results. Shawna Orleans, RN

## 2017-07-30 ENCOUNTER — Ambulatory Visit
Admission: RE | Admit: 2017-07-30 | Discharge: 2017-07-30 | Disposition: A | Discharge status: Home / Self Care | Payer: Medicaid Other | Source: Ambulatory Visit | Attending: Family Medicine | Admitting: Family Medicine

## 2017-07-30 DIAGNOSIS — M25462 Effusion, left knee: Secondary | ICD-10-CM

## 2017-08-01 ENCOUNTER — Telehealth: Payer: Self-pay

## 2017-08-01 NOTE — Telephone Encounter (Signed)
Pt calling for ultrasound results. Call back [phone] Shawna Orleans, RN

## 2017-08-01 NOTE — Telephone Encounter (Signed)
Called and talked to patient about her ultrasound results which was normal.  I have already sent her my chart message as well. She says she continues to experience dizziness.  I recommend calling the clinic and scheduling a follow-up appointment on this. She voiced understanding and agrees.

## 2017-08-08 ENCOUNTER — Ambulatory Visit: Payer: Medicaid Other | Admitting: Family Medicine

## 2017-09-01 ENCOUNTER — Telehealth: Payer: Self-pay

## 2017-09-01 DIAGNOSIS — R5383 Other fatigue: Secondary | ICD-10-CM

## 2017-09-01 DIAGNOSIS — R42 Dizziness and giddiness: Secondary | ICD-10-CM

## 2017-09-01 NOTE — Telephone Encounter (Signed)
Patient called stating her dizziness has not improved, in fact it is worse. She is also experiencing fatigue still. Requests a referral to a specialist, such as a vestibular specialist to possibly get her symptoms looked at again.  Attempted to make appt for f/u with PCP as noted in result note of 08/01/17. Patient was unable to come to appt she scheduled after that conversation but feels like she only has limited energy and does not want to come back here but go on to a specialist.  Advised that note would be routed.  Call back is [phone]  Ples SpecterAlisa Wagner Tanzi, RN [facility])

## 2017-09-01 NOTE — Telephone Encounter (Signed)
Dr. Alanda SlimGonfa is on vacation and I am covering his box.   Called patient to further discuss. She reports that at the visit there was discussion about possible vertiginous etiology to her symptoms. Not specifically noted on the visit note. She does not want to return to clinic for re-evaluation. She would like a referral to vestibular specialist. Offered referral to vestibular rehab.  She also mentions that she is still fatigued. She had stopped taking the herbal supplements and is only taking a daily multivitamin. She stated that she discussed with the PCP about checking thyroid and if normal would refer to endocrinology. This was not directly mentioned in the note. Her lab work has been normal. I did go ahead and make a referral to endocrinology.

## 2020-03-01 IMAGING — US US EXTREM LOW*L* LIMITED
1 series · 8 of 8 positions shown · non-contrast
Comparison: No prior.

CLINICAL DATA: Knee joint swelling. Palpable abnormality over
popliteal fossa.

EXAM:
ULTRASOUND LOWER EXTREMITY LIMITED
TECHNIQUE: Ultrasound examination of the lower extremity soft tissues was
performed in the area of clinical concern.

[Series 1: us extrem low*left* limited · 0.07mm/px · 8 of 8 slices shown]
[im 1/8]
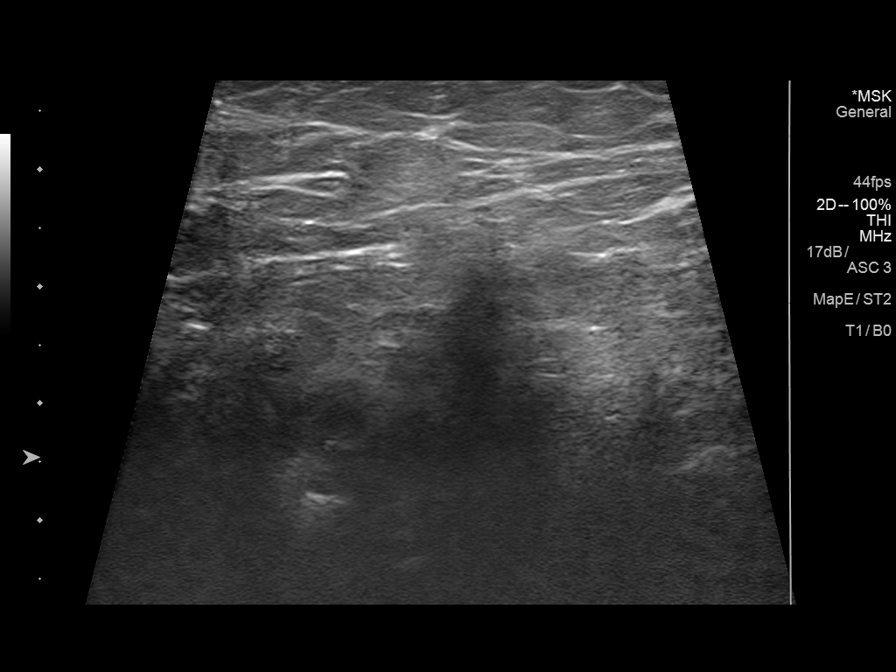
[im 2/8]
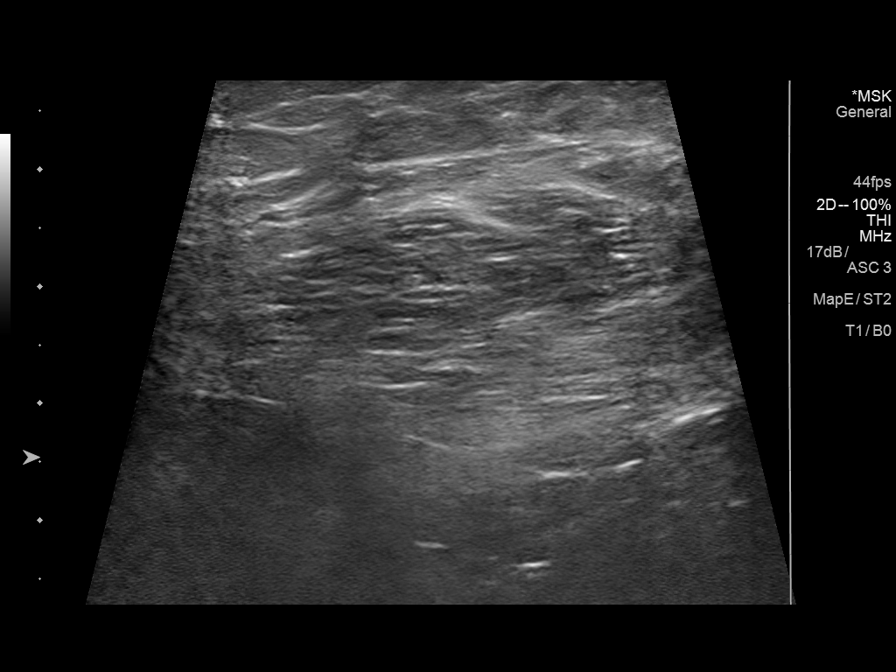
[im 3/8]
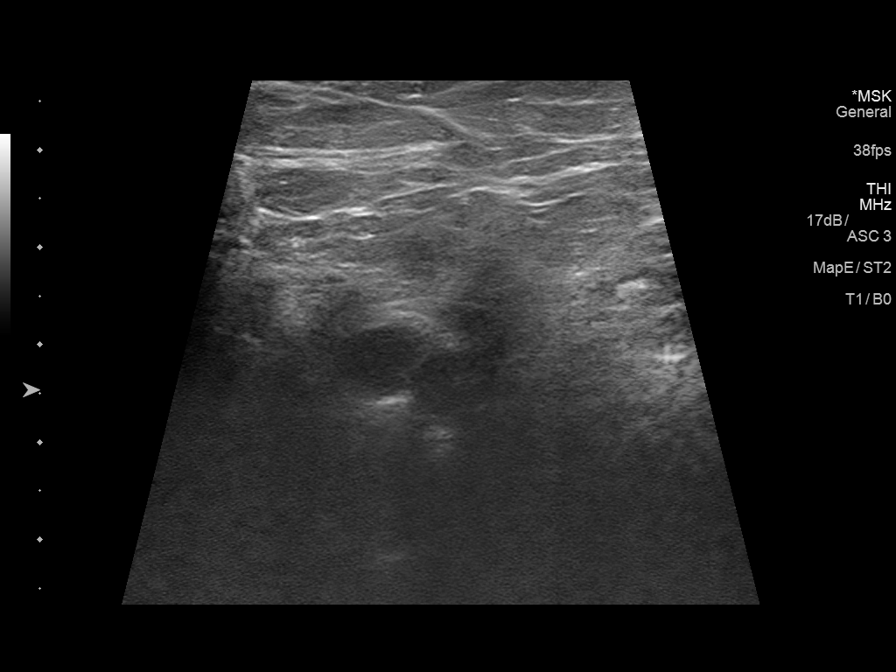
[im 4/8]
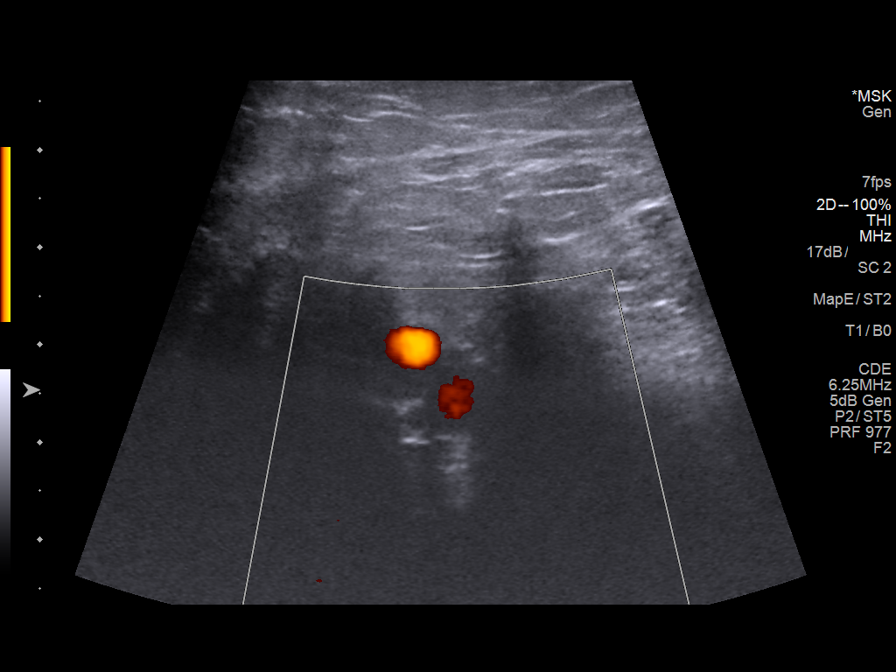
[im 5/8]
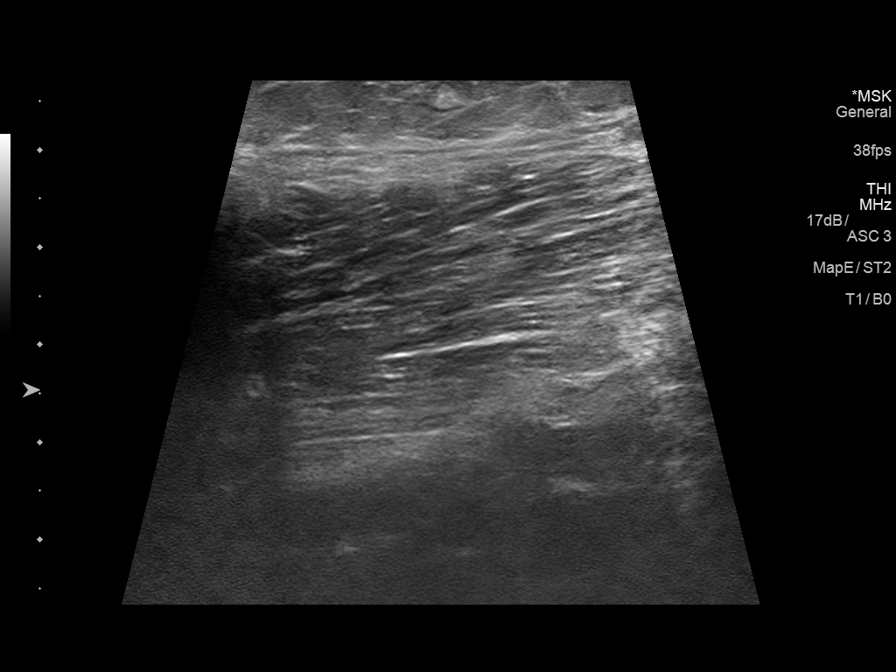
[im 6/8]
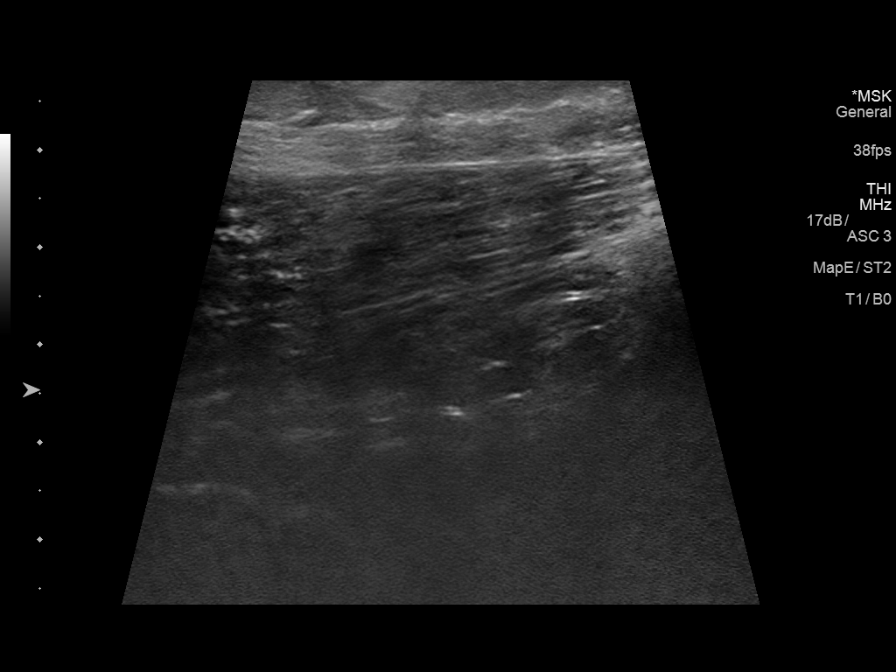
[im 7/8]
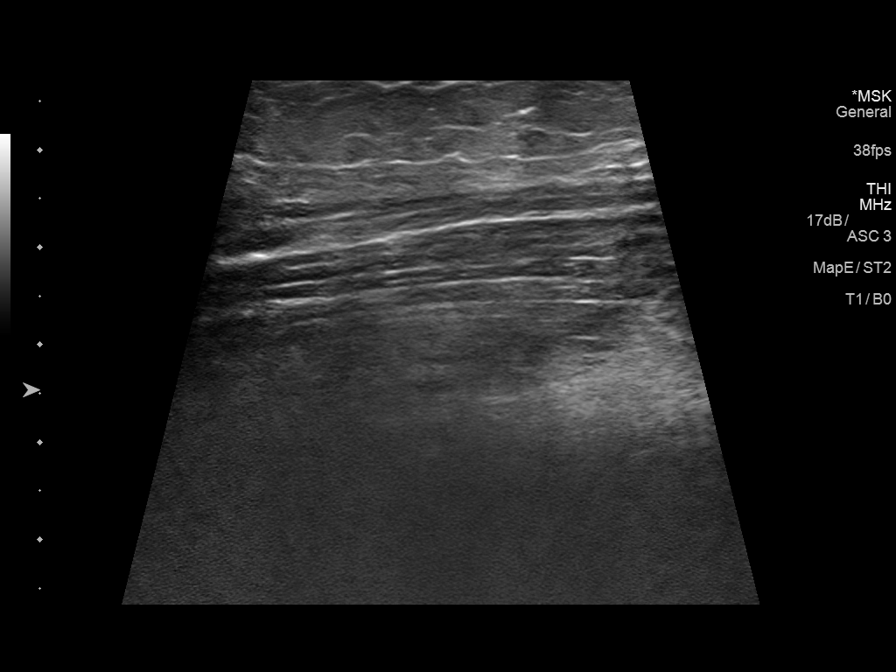
[im 8/8]
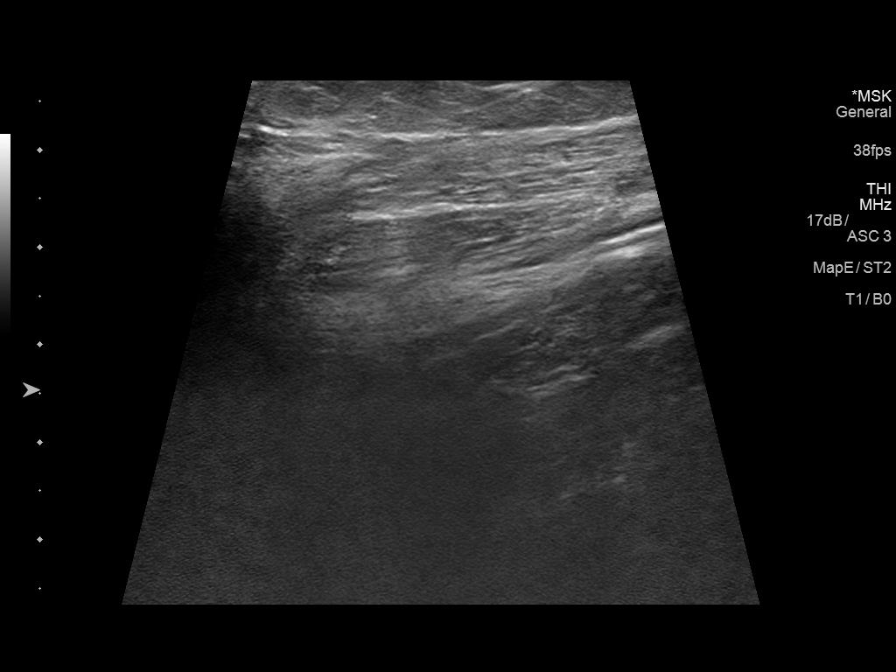

[8 of 8 positions shown; findings below may reference images not displayed]

FINDINGS: No cystic or solid abnormalities identified. No abnormalities noted
the region of palpable abnormality. No Baker's cyst noted.
IMPRESSION: Negative exam.  No Baker's cyst noted.

## 2021-08-21 ENCOUNTER — Encounter: Payer: Self-pay | Admitting: *Deleted
